# Patient Record
Sex: Female | Born: 1995 | Race: Black or African American | Hispanic: No | Marital: Single | Smoking: Never smoker
Health system: Southern US, Community
[De-identification: ages and names within clinical notes are randomized; demographics above are authoritative.]

## PROBLEM LIST (undated history)

## (undated) DIAGNOSIS — N83201 Unspecified ovarian cyst, right side: Secondary | ICD-10-CM

## (undated) DIAGNOSIS — N83202 Unspecified ovarian cyst, left side: Secondary | ICD-10-CM

## (undated) DIAGNOSIS — D649 Anemia, unspecified: Secondary | ICD-10-CM

## (undated) HISTORY — DX: Anemia, unspecified: D64.9

---

## 2017-07-03 ENCOUNTER — Other Ambulatory Visit: Payer: Self-pay

## 2017-07-03 ENCOUNTER — Encounter (HOSPITAL_COMMUNITY): Payer: Self-pay | Admitting: Emergency Medicine

## 2017-07-03 DIAGNOSIS — Z5321 Procedure and treatment not carried out due to patient leaving prior to being seen by health care provider: Secondary | ICD-10-CM | POA: Insufficient documentation

## 2017-07-03 DIAGNOSIS — G43909 Migraine, unspecified, not intractable, without status migrainosus: Secondary | ICD-10-CM | POA: Insufficient documentation

## 2017-07-03 NOTE — ED Triage Notes (Signed)
Pt reports having headache for the last two weeks along with sensitivity to light along with craving for salt. Pt reports taking tylenol with no relief. Pt denies any nausea or vomiting.

## 2017-07-04 ENCOUNTER — Emergency Department (HOSPITAL_COMMUNITY)
Admission: EM | Admit: 2017-07-04 | Discharge: 2017-07-04 | Disposition: A | Discharge status: Home / Self Care | Payer: Self-pay | Attending: Emergency Medicine | Admitting: Emergency Medicine

## 2017-07-04 HISTORY — DX: Unspecified ovarian cyst, right side: N83.201

## 2017-07-04 HISTORY — DX: Unspecified ovarian cyst, left side: N83.202

## 2017-07-04 NOTE — ED Notes (Signed)
Pt called from the lobby with no response 

## 2017-08-25 ENCOUNTER — Emergency Department (HOSPITAL_COMMUNITY)
Admission: EM | Admit: 2017-08-25 | Discharge: 2017-08-25 | Disposition: A | Discharge status: Home / Self Care | Payer: BLUE CROSS/BLUE SHIELD | Attending: Emergency Medicine | Admitting: Emergency Medicine

## 2017-08-25 ENCOUNTER — Encounter (HOSPITAL_COMMUNITY): Payer: Self-pay | Admitting: Emergency Medicine

## 2017-08-25 ENCOUNTER — Emergency Department (HOSPITAL_COMMUNITY): Payer: BLUE CROSS/BLUE SHIELD

## 2017-08-25 DIAGNOSIS — R51 Headache: Secondary | ICD-10-CM | POA: Diagnosis not present

## 2017-08-25 DIAGNOSIS — R519 Headache, unspecified: Secondary | ICD-10-CM

## 2017-08-25 LAB — I-STAT CHEM 8, ED
BUN: 9 mg/dL (ref 6–20)
Calcium, Ion: 1.18 mmol/L (ref 1.15–1.40)
Chloride: 102 mmol/L (ref 101–111)
Creatinine, Ser: 0.8 mg/dL (ref 0.44–1.00)
Glucose, Bld: 82 mg/dL (ref 65–99)
HEMATOCRIT: 41 % (ref 36.0–46.0)
HEMOGLOBIN: 13.9 g/dL (ref 12.0–15.0)
POTASSIUM: 3.3 mmol/L — AB (ref 3.5–5.1)
Sodium: 140 mmol/L (ref 135–145)
TCO2: 27 mmol/L (ref 22–32)

## 2017-08-25 LAB — POC URINE PREG, ED: PREG TEST UR: NEGATIVE

## 2017-08-25 MED ORDER — BUTALBITAL-APAP-CAFFEINE 50-325-40 MG PO TABS: 1.0000 | ORAL_TABLET | Freq: Four times a day (QID) | ORAL | 0 refills | Status: DC | PRN

## 2017-08-25 NOTE — ED Provider Notes (Signed)
[facility]-EMERGENCY DEPT Provider Note   CSN: [id number] Arrival date & time: 08/25/17  1937     History   Chief Complaint Chief Complaint  Patient presents with  . Headache    HPI Elizabeth Holland is a 22 y.o. female.  HPI   22 year old female with history of ovarian cyst presenting for complaints of headache.  Patient report for the past 2 months she has had recurrent headache.  Headache would happen just about every other day.  She described the headache as a throbbing sensation to her forehead usually worse at nighttime, improves with taking ibuprofen.  Symptom is still recurring, headache is rated as 5 out of 10 currently.  No associated fever, chills, URI symptoms, diplopia, loss of vision, spots in vision, neck stiffness, chest pain, trouble breathing, focal numbness or weakness or rash.  Nothing seems to aggravate her headache.  She was seen by an eye specialist recently for her headache and was told that her vision was fine.  She is here at the recommendation of her parent for further evaluation of her recurrent headache.  She denies any increasing stress or change in her diet.  Past Medical History:  Diagnosis Date  . Bilateral ovarian cysts     There are no active problems to display for this patient.   History reviewed. No pertinent surgical history.  OB History    No data available       Home Medications    Prior to Admission medications   Not on File    Family History No family history on file.  Social History Social History   Tobacco Use  . Smoking status: Never Smoker  . Smokeless tobacco: Never Used  Substance Use Topics  . Alcohol use: Yes    Comment: occasional  . Drug use: No     Allergies   Amoxicillin   Review of Systems Review of Systems  All other systems reviewed and are negative.    Physical Exam Updated Vital Signs BP 120/76 (BP Location: Right Arm)   Pulse 79   Temp 98.2 F (36.8 C) (Oral)    Resp 16   LMP 05/20/2017   SpO2 100%   Physical Exam  Constitutional: She is oriented to person, place, and time. She appears well-developed and well-nourished. No distress.  Patient is well-appearing in no acute discomfort.  HENT:  Head: Normocephalic and atraumatic.  Mouth/Throat: Oropharynx is clear and moist.  Eyes: Conjunctivae and EOM are normal. Pupils are equal, round, and reactive to light.  Neck: Neck supple. No neck rigidity.  Cardiovascular: Normal rate and regular rhythm.  Pulmonary/Chest: Effort normal and breath sounds normal.  Abdominal: Soft. There is no tenderness.  Musculoskeletal: Normal range of motion.  Neurological: She is alert and oriented to person, place, and time. She has normal strength. She displays a negative Romberg sign. Coordination and gait normal. GCS eye subscore is 4. GCS verbal subscore is 5. GCS motor subscore is 6.  Skin: Skin is warm. No rash noted.  Psychiatric: She has a normal mood and affect.  Nursing note and vitals reviewed.    ED Treatments / Results  Labs (all labs ordered are listed, but only abnormal results are displayed) Labs Reviewed - No data to display  EKG  EKG Interpretation None       Radiology No results found.  Procedures Procedures (including critical care time)  Medications Ordered in ED Medications - No data to display   Initial Impression / Assessment  and Plan / ED Course  I have reviewed the triage vital signs and the nursing notes.  Pertinent labs & imaging results that were available during my care of the patient were reviewed by me and considered in my medical decision making (see chart for details).     BP 112/84   Pulse 84   Temp 98.2 F (36.8 C) (Oral)   Resp 18   LMP 05/20/2017   SpO2 100%    Final Clinical Impressions(s) / ED Diagnoses   Final diagnoses:  Recurrent headache    ED Discharge Orders        Ordered    butalbital-acetaminophen-caffeine (FIORICET, ESGIC) 50-325-40  MG tablet  Every 6 hours PRN     08/25/17 2229     8:56 PM Patient here with recurrent frontal headache.  This has been ongoing for 2 months.  She has no focal neuro deficit on exam.  No thunderclap severe headaches suggestive of subarachnoid hemorrhage, no focal neuro deficit to suggest space-occupying lesion or stroke.  No fever or nuchal rigidity concerning for meningitis.  No history of IV drug use or active cancer.  She does request to have a CT scan of her head.  She acknowledged the risk and benefit of CT scan.  10:22 PM Pregnancy test is negative.  I-STAT Chem-8 is reassuring, normal H&H, mild hypokalemia with a potassium of 3.3.  I encourage patient to eat a banana daily this week to help replenish her potassium.  Her head CT scan is totally normal.  Patient felt reassured.  Patient will be discharged home with Fioricet to use as needed for headache.  Recommend patient to follow-up with neurology as well as to keep a headache diary to help identify the cause of her headache.  Return precautions discussed.   Fayrene Helperran, Courtlynn Holloman, PA-C 08/25/17 2230    Donnetta Hutchingook, Brian, MD 08/27/17 2028

## 2017-08-25 NOTE — ED Triage Notes (Signed)
Patient c/o headaches x 2 months. Pt states she went to eye doc to see if that was causing pain. No issues with eye sight. Pain worsens when driving at night. Denies any light sensitivity or nausea. Reports taking ibuprofen daily and melatonin to help sleep.

## 2017-10-15 ENCOUNTER — Emergency Department (HOSPITAL_COMMUNITY)
Admission: EM | Admit: 2017-10-15 | Discharge: 2017-10-15 | Disposition: A | Discharge status: Home / Self Care | Payer: BLUE CROSS/BLUE SHIELD | Attending: Emergency Medicine | Admitting: Emergency Medicine

## 2017-10-15 ENCOUNTER — Encounter (HOSPITAL_COMMUNITY): Payer: Self-pay | Admitting: Emergency Medicine

## 2017-10-15 DIAGNOSIS — Z79899 Other long term (current) drug therapy: Secondary | ICD-10-CM | POA: Diagnosis not present

## 2017-10-15 DIAGNOSIS — J029 Acute pharyngitis, unspecified: Secondary | ICD-10-CM | POA: Insufficient documentation

## 2017-10-15 LAB — GROUP A STREP BY PCR: GROUP A STREP BY PCR: NOT DETECTED

## 2017-10-15 MED ORDER — AZITHROMYCIN 250 MG PO TABS: 250.0000 mg | ORAL_TABLET | Freq: Every day | ORAL | 0 refills | Status: DC

## 2017-10-15 NOTE — ED Provider Notes (Signed)
[facility]-EMERGENCY DEPT Provider Note   CSN: [id number] Arrival date & time: 10/15/17  [id number]     History   Chief Complaint Chief Complaint  Patient presents with  . Sore Throat    HPI Elizabeth Holland is a 22 y.o. female.  HPI  Elizabeth Holland is a 22 y.o. female presents to emergency department complaining of sore throat for 2 days.  She reports associated fever, minor nasal congestion, reports no cough, no nausea, vomiting, diarrhea.  States painful to swallow but still able to swallow.  Denies change in voice.  States fever for the last 2 days, improved today.  States has had strep throat in the past feels the same.  Has been taking Tylenol for her pain.   Past Medical History:  Diagnosis Date  . Bilateral ovarian cysts     There are no active problems to display for this patient.   History reviewed. No pertinent surgical history.   OB History   None      Home Medications    Prior to Admission medications   Medication Sig Start Date End Date Taking? Authorizing Provider  butalbital-acetaminophen-caffeine (FIORICET, ESGIC) [phone] MG tablet Take 1 tablet by mouth every 6 (six) hours as needed for headache. 08/25/17 08/25/18 Yes Fayrene Helperran, [place], PA-C  etonogestrel (NEXPLANON) 68 MG IMPL implant Inject 1 each into the skin once.   Yes [provider]  ibuprofen (ADVIL,MOTRIN) 200 MG tablet Take 600 mg by mouth every 6 (six) hours as needed for moderate pain.   Yes [provider]  MELATONIN PO Take 1 tablet by mouth at bedtime as needed (sleep).   Yes [provider]    Family History No family history on file.  Social History Social History   Tobacco Use  . Smoking status: Never Smoker  . Smokeless tobacco: Never Used  Substance Use Topics  . Alcohol use: Yes    Comment: occasional  . Drug use: No     Allergies   Amoxicillin   Review of Systems Review of Systems  Constitutional: Positive for chills and  fever.  HENT: Positive for congestion and sore throat.   Respiratory: Negative for cough, chest tightness and shortness of breath.   Cardiovascular: Negative for chest pain, palpitations and leg swelling.  Gastrointestinal: Negative for abdominal pain, diarrhea, nausea and vomiting.  Musculoskeletal: Negative for arthralgias, myalgias, neck pain and neck stiffness.  Skin: Negative for rash.  Neurological: Negative for dizziness, weakness and headaches.  All other systems reviewed and are negative.    Physical Exam Updated Vital Signs BP 117/72 (BP Location: Left Arm)   Pulse 87   Temp 98.1 F (36.7 C) (Oral)   Resp 14   Ht 5\' 2"  (1.575 m)   LMP 09/27/2017   SpO2 99%   BMI 31.09 kg/m   Physical Exam  Constitutional: She appears well-developed and well-nourished. No distress.  HENT:  Head: Normocephalic.  Oropharynx is erythematous, tonsils 2+ bilaterally, exudate present.  Uvula is midline.  No trismus.  Eyes: Conjunctivae are normal.  Neck: Neck supple.  Tender anterior cervical lymphadenopathy  Cardiovascular: Normal rate, regular rhythm and normal heart sounds.  Pulmonary/Chest: Effort normal and breath sounds normal. No respiratory distress. She has no wheezes. She has no rales.  Abdominal: She exhibits no distension.  Musculoskeletal: She exhibits no edema.  Lymphadenopathy:    She has cervical adenopathy.  Neurological: She is alert.  Skin: Skin is warm and dry.  Psychiatric: She has a normal  mood and affect. Her behavior is normal.  Nursing note and vitals reviewed.    ED Treatments / Results  Labs (all labs ordered are listed, but only abnormal results are displayed) Labs Reviewed  GROUP A STREP BY PCR    EKG None  Radiology No results found.  Procedures Procedures (including critical care time)  Medications Ordered in ED Medications - No data to display   Initial Impression / Assessment and Plan / ED Course  I have reviewed the triage vital  signs and the nursing notes.  Pertinent labs & imaging results that were available during my care of the patient were reviewed by me and considered in my medical decision making (see chart for details).     Patient emergency department with 2 days of sore throat, white exudate noticed on tonsils bilaterally, tonsils are erythematous, mildly enlarged.  Uvula is midline, no concern about peritonsillar or retropharyngeal abscess based on the exam.  She is able to swallow.  She is afebrile here, normal vital signs.  Most likely viral versus bacterial pharyngitis.  Strep by PCR is negative.  Discussed symptomatic treatment for viral pharyngitis, and if not improving, I will prescribe her antibiotic that she can take.  Strongly encouraged to not take antibiotics unless she is not getting better.  Patient is concerned that it is holiday weekend, he does not want to come back to emergency department, so I will give her prescription.  Return precautions discussed.  Vitals:   10/15/17 0934  BP: 117/72  Pulse: 87  Resp: 14  Temp: 98.1 F (36.7 C)  SpO2: 99%    Final Clinical Impressions(s) / ED Diagnoses   Final diagnoses:  Pharyngitis, unspecified etiology    ED Discharge Orders        Ordered    azithromycin (ZITHROMAX) 250 MG tablet  Daily     10/15/17 1226       Jaynie Crumble, PA-C 10/15/17 1227    Shaune Pollack, MD 10/16/17 [phone]

## 2017-10-15 NOTE — Discharge Instructions (Addendum)
Your sore throat is most likely is caused by a viral infection, which is self limited and most of the time requires no prescription medications.   Take Tylenol or Motrin for pain.  Salt water gargles several times a day.  Drink plenty of fluids.  If not improving in 2 to 3 days, take antibiotics as prescribed.  Return if worsening symptoms.

## 2017-10-15 NOTE — ED Notes (Signed)
Bed: WTR9 Expected date:  Expected time:  Means of arrival:  Comments: 

## 2017-10-15 NOTE — ED Triage Notes (Signed)
Pt c/o sore throat couple days and noticed white spots on her throat this morning.

## 2018-05-28 ENCOUNTER — Other Ambulatory Visit: Payer: Self-pay

## 2018-05-28 ENCOUNTER — Encounter (HOSPITAL_COMMUNITY): Payer: Self-pay

## 2018-05-28 ENCOUNTER — Emergency Department (HOSPITAL_COMMUNITY)
Admission: EM | Admit: 2018-05-28 | Discharge: 2018-05-28 | Disposition: A | Discharge status: Home / Self Care | Payer: BLUE CROSS/BLUE SHIELD | Attending: Emergency Medicine | Admitting: Emergency Medicine

## 2018-05-28 DIAGNOSIS — E876 Hypokalemia: Secondary | ICD-10-CM | POA: Diagnosis not present

## 2018-05-28 DIAGNOSIS — D649 Anemia, unspecified: Secondary | ICD-10-CM | POA: Diagnosis not present

## 2018-05-28 DIAGNOSIS — Z79899 Other long term (current) drug therapy: Secondary | ICD-10-CM | POA: Diagnosis not present

## 2018-05-28 DIAGNOSIS — R002 Palpitations: Secondary | ICD-10-CM | POA: Diagnosis not present

## 2018-05-28 LAB — CBC WITH DIFFERENTIAL/PLATELET
Abs Immature Granulocytes: 0.02 10*3/uL (ref 0.00–0.07)
BASOS PCT: 1 %
Basophils Absolute: 0 10*3/uL (ref 0.0–0.1)
EOS ABS: 0.1 10*3/uL (ref 0.0–0.5)
EOS PCT: 1 %
HEMATOCRIT: 36.7 % (ref 36.0–46.0)
Hemoglobin: 11.2 g/dL — ABNORMAL LOW (ref 12.0–15.0)
IMMATURE GRANULOCYTES: 0 %
LYMPHS ABS: 2.5 10*3/uL (ref 0.7–4.0)
Lymphocytes Relative: 36 %
MCH: 26.5 pg (ref 26.0–34.0)
MCHC: 30.5 g/dL (ref 30.0–36.0)
MCV: 87 fL (ref 80.0–100.0)
MONOS PCT: 11 %
Monocytes Absolute: 0.7 10*3/uL (ref 0.1–1.0)
NEUTROS PCT: 51 %
Neutro Abs: 3.5 10*3/uL (ref 1.7–7.7)
PLATELETS: 261 10*3/uL (ref 150–400)
RBC: 4.22 MIL/uL (ref 3.87–5.11)
RDW: 13.3 % (ref 11.5–15.5)
WBC: 6.9 10*3/uL (ref 4.0–10.5)
nRBC: 0 % (ref 0.0–0.2)

## 2018-05-28 LAB — BASIC METABOLIC PANEL
ANION GAP: 6 (ref 5–15)
BUN: 10 mg/dL (ref 6–20)
CALCIUM: 8.7 mg/dL — AB (ref 8.9–10.3)
CO2: 26 mmol/L (ref 22–32)
Chloride: 105 mmol/L (ref 98–111)
Creatinine, Ser: 0.78 mg/dL (ref 0.44–1.00)
GLUCOSE: 93 mg/dL (ref 70–99)
Potassium: 3.4 mmol/L — ABNORMAL LOW (ref 3.5–5.1)
Sodium: 137 mmol/L (ref 135–145)

## 2018-05-28 LAB — TSH: TSH: 0.985 u[IU]/mL (ref 0.350–4.500)

## 2018-05-28 LAB — I-STAT BETA HCG BLOOD, ED (MC, WL, AP ONLY): I-stat hCG, quantitative: <5 m[IU]/mL (ref <5)

## 2018-05-28 NOTE — Discharge Instructions (Addendum)
Try a multivitamin once daily for low potassium and calcium Please f/u with Cardiology if you continue to have symptoms

## 2018-05-28 NOTE — ED Provider Notes (Signed)
[facility]-EMERGENCY DEPT Provider Note   CSN: [id number] Arrival date & time: 05/28/18  1631     History   Chief Complaint Chief Complaint  Patient presents with  . Palpitations    HPI Elizabeth Holland is a 22 y.o. female who presents with heart palpitations.  No significant past medical history the patient states that for the past month she has had intermittent palpitations she feels in her chest and her throat.  She states that she initially did not think anything of it however it has become more frequent and is happening every other day now therefore she decided to come to the ED to have it checked.  She was getting go to a primary care provider however could not get an appointment because she is not from here.  She is currently going to and [employer] and works 2 jobs.  She recently started a new job this past week.  She is unsure if her symptoms are due to anxiety or not.  She does not feel particularly anxious.  She denies any excessive caffeine intake, illicit drug use.  She has mild shortness of breath with the palpitations which will last a couple of seconds and then resolve spontaneously.  The palpitations will happen at random times with exertion or at rest.  She denies any fever, lightheadedness, syncope, chest pain, leg swelling.  She denies any current symptoms.  HPI  Past Medical History:  Diagnosis Date  . Bilateral ovarian cysts     There are no active problems to display for this patient.   History reviewed. No pertinent surgical history.   OB History   None      Home Medications    Prior to Admission medications   Medication Sig Start Date End Date Taking? Authorizing Provider  etonogestrel (NEXPLANON) 68 MG IMPL implant Inject 1 each into the skin once.   Yes [provider]  butalbital-acetaminophen-caffeine (FIORICET, ESGIC) 50-325-40 MG tablet Take 1 tablet by mouth every 6 (six) hours as needed for headache. Patient not  taking: Reported on 05/28/2018 08/25/17 08/25/18  Fayrene Helperran, [place], PA-C    Family History History reviewed. No pertinent family history.  Social History Social History   Tobacco Use  . Smoking status: Never Smoker  . Smokeless tobacco: Never Used  Substance Use Topics  . Alcohol use: Yes    Comment: occasional  . Drug use: No     Allergies   Amoxicillin   Review of Systems Review of Systems  Constitutional: Negative for fever.  Respiratory: Positive for shortness of breath (resolved).   Cardiovascular: Positive for palpitations. Negative for chest pain and leg swelling.  Neurological: Negative for syncope and light-headedness.  All other systems reviewed and are negative.    Physical Exam Updated Vital Signs BP 120/75 (BP Location: Right Arm)   Pulse 68   Temp 98.7 F (37.1 C) (Oral)   Resp (!) 9   SpO2 100%   Physical Exam  Constitutional: She is oriented to person, place, and time. She appears well-developed and well-nourished. No distress.  Calm, cooperative. Well appearing  HENT:  Head: Normocephalic and atraumatic.  Eyes: Pupils are equal, round, and reactive to light. Conjunctivae are normal. Right eye exhibits no discharge. Left eye exhibits no discharge. No scleral icterus.  Neck: Normal range of motion.  Cardiovascular: Normal rate and regular rhythm.  Pulmonary/Chest: Effort normal and breath sounds normal. No respiratory distress.  Abdominal: Soft. Bowel sounds are normal. She exhibits no distension.  There is no tenderness.  Musculoskeletal:  No peripheral edema  Neurological: She is alert and oriented to person, place, and time.  Skin: Skin is warm and dry.  Psychiatric: She has a normal mood and affect. Her behavior is normal.  Nursing note and vitals reviewed.    ED Treatments / Results  Labs (all labs ordered are listed, but only abnormal results are displayed) Labs Reviewed  BASIC METABOLIC PANEL - Abnormal; Notable for the following  components:      Result Value   Potassium 3.4 (*)    Calcium 8.7 (*)    All other components within normal limits  CBC WITH DIFFERENTIAL/PLATELET - Abnormal; Notable for the following components:   Hemoglobin 11.2 (*)    All other components within normal limits  TSH  I-STAT BETA HCG BLOOD, ED ([facility], [facility], [facility] ONLY)    EKG None  Radiology No results found.  Procedures Procedures (including critical care time)  Medications Ordered in ED Medications - No data to display   Initial Impression / Assessment and Plan / ED Course  I have reviewed the triage vital signs and the nursing notes.  Pertinent labs & imaging results that were available during my care of the patient were reviewed by me and considered in my medical decision making (see chart for details).  22 year old with palpitations for the past month. Vitals are normal. Exam is unremarkable. Unclear etiology - possibly electrolyte imbalance vs anxiety/stress. EKG is SR. CBC is remarkable for mild anemia. BMP is remarkable for mild hypokalemia and hypocalcemia. TSH is normal. Encouraged her to take a multivitamin and to f/u with Cards if she continues to have symptoms. She verbalized understanding.  Final Clinical Impressions(s) / ED Diagnoses   Final diagnoses:  Palpitations  Hypokalemia  Hypocalcemia  Anemia, unspecified type    ED Discharge Orders    None       Bethel Born, PA-C 05/28/18 2218    Sabas Sous, MD 05/29/18 [phone]

## 2018-05-28 NOTE — ED Notes (Signed)
Bed: WA02 Expected date:  Expected time:  Means of arrival:  Comments: 

## 2018-05-28 NOTE — ED Triage Notes (Signed)
Pt reports intermittent heart palpitations over the last month. She reports 2 episodes this morning. States that she is symptoms free at time of triage. Denies SOB, N/V/D or anxiety. States that she cannot identify a cause and it seems random when the palpitations start and they do not last long. VSS. A&Ox4.

## 2018-06-01 IMAGING — CT CT HEAD W/O CM
3 series · 16 of 47 positions shown, 19 images · non-contrast
Comparison: None.

CLINICAL DATA: Headaches

EXAM:
CT HEAD WITHOUT CONTRAST
TECHNIQUE: Contiguous axial images were obtained from the base of the skull
through the vertex without intravenous contrast.

[Series 2: head wo · axial · 0.47mm/px · z∈[+1536,+1661]mm · 10 of 30 slices shown, 13 images]
[im 3/30  brain]
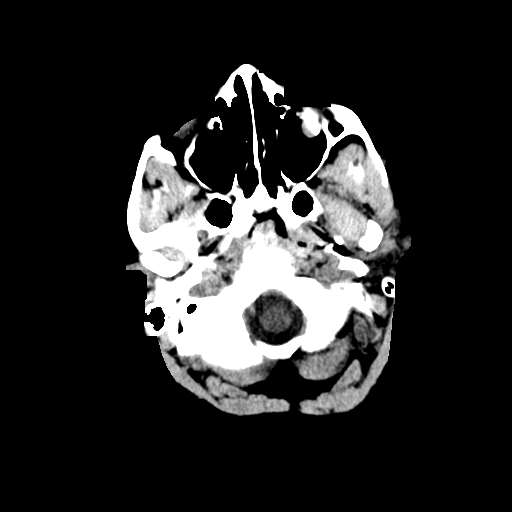
[im 3/30  bone]
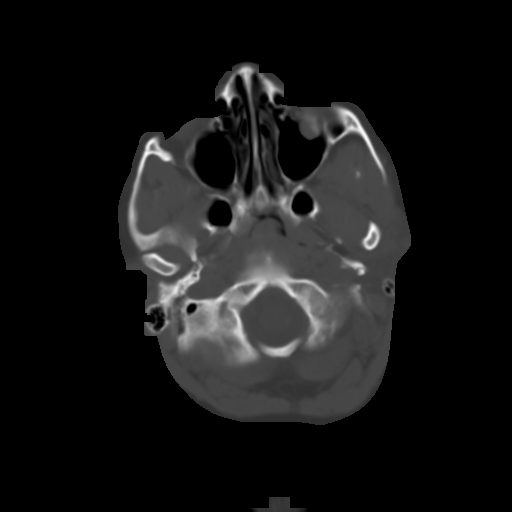
[im 6/30  brain]
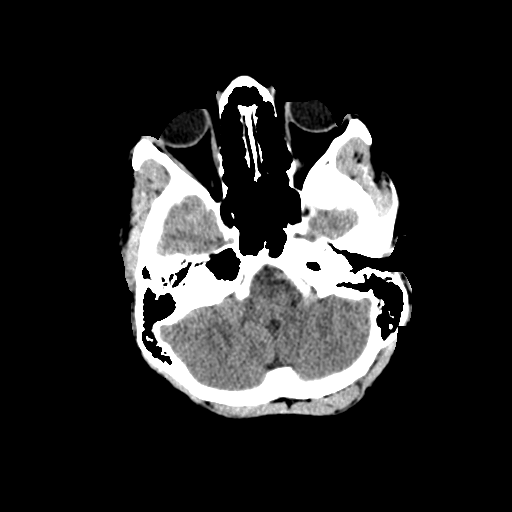
[im 9/30  brain]
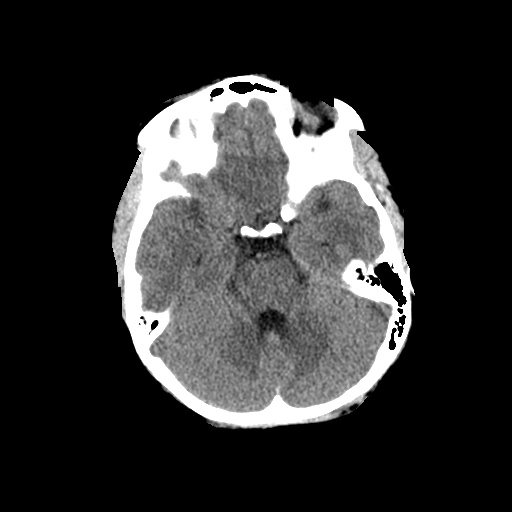
[im 11/30  brain]
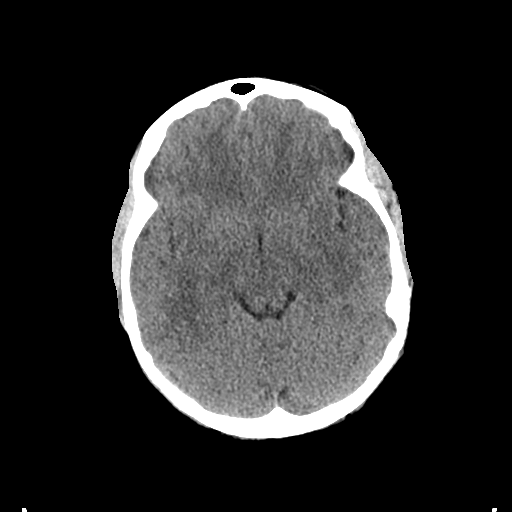
[im 14/30  brain]
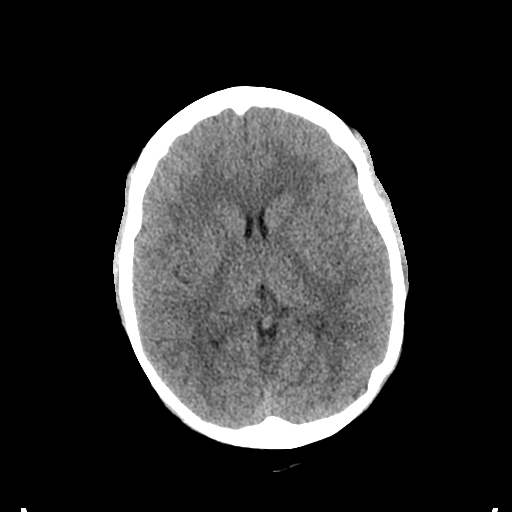
[im 14/30  bone]
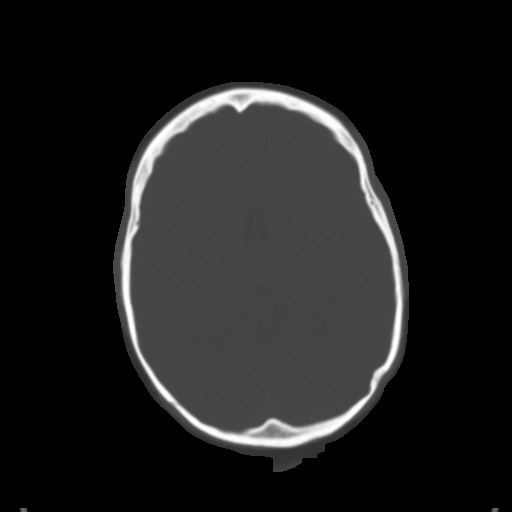
[im 17/30  brain]
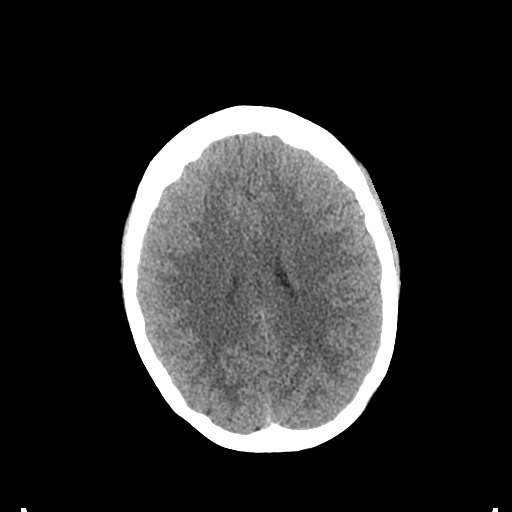
[im 20/30  brain]
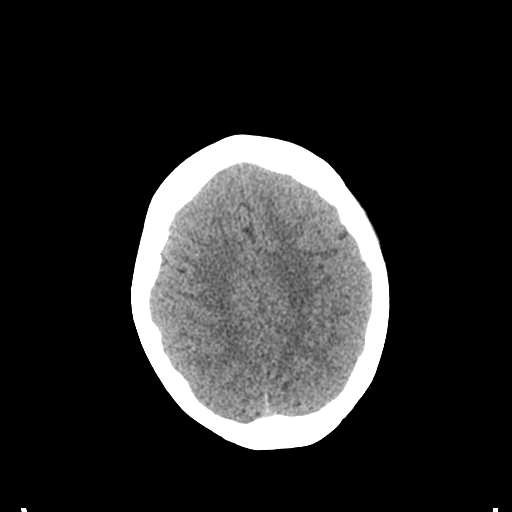
[im 23/30  brain]
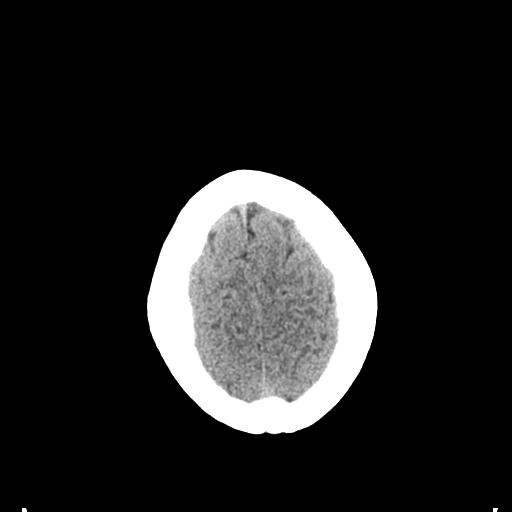
[im 25/30  brain]
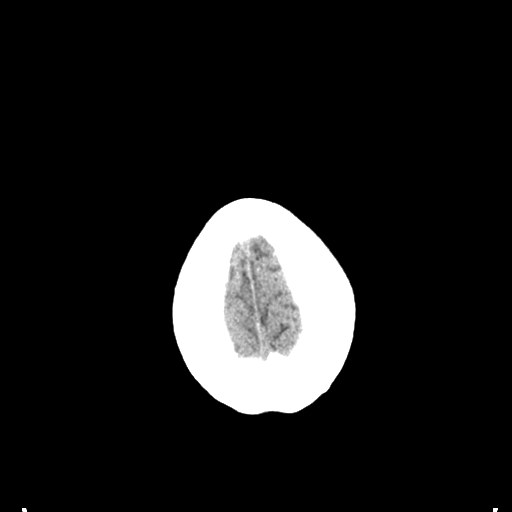
[im 25/30  bone]
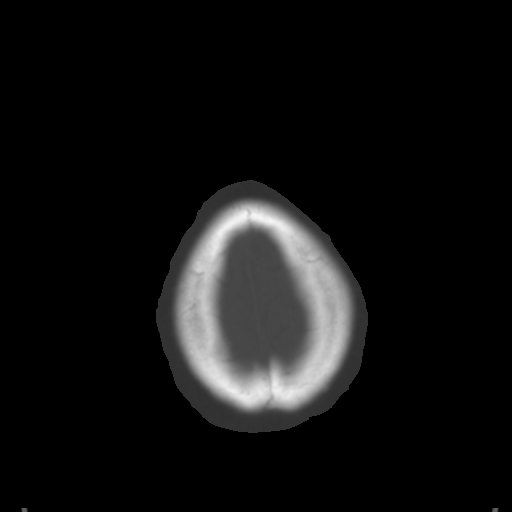
[im 28/30  brain]
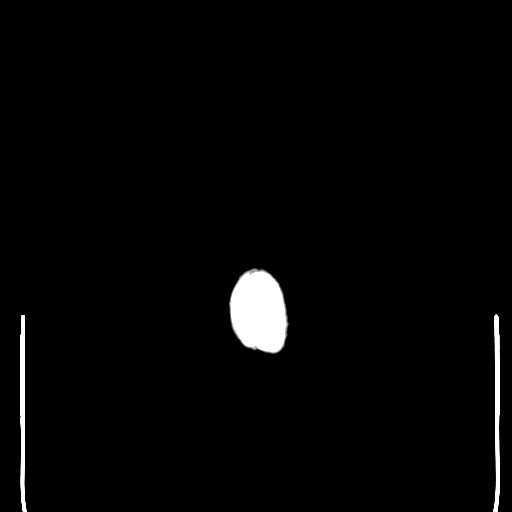

[Series 4: coronal soft tissue · coronal · 0.28mm/px · 3 of 59 slices shown]
[im 20/59  brain]
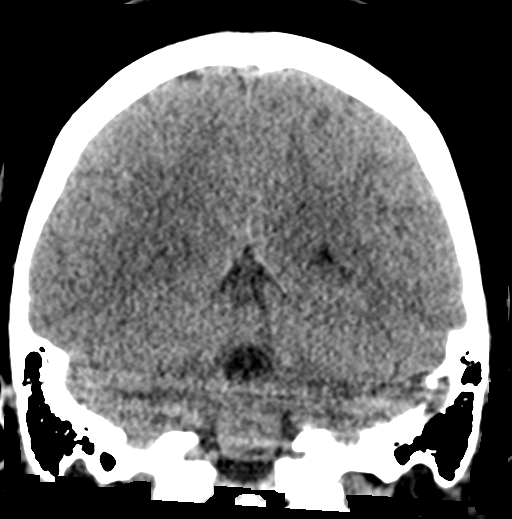
[im 26/59  brain]
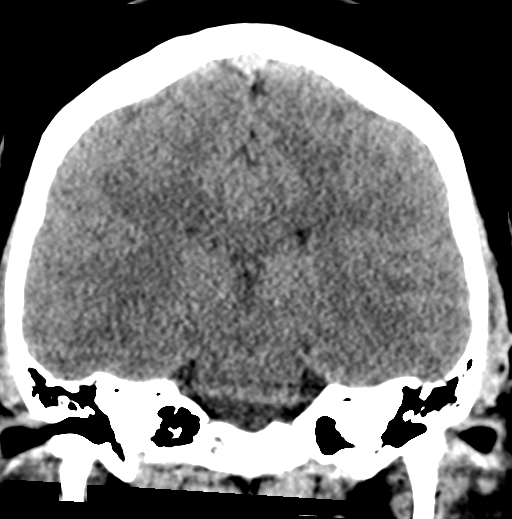
[im 33/59  brain]
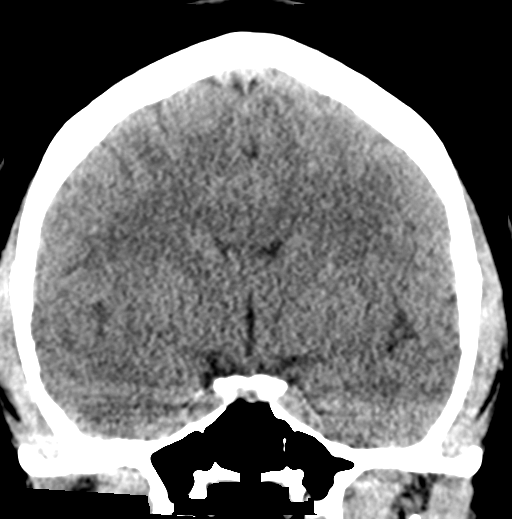

[Series 5: sagittal soft tissue · sagittal · 0.28mm/px · 3 of 48 slices shown]
[im 16/48  brain]
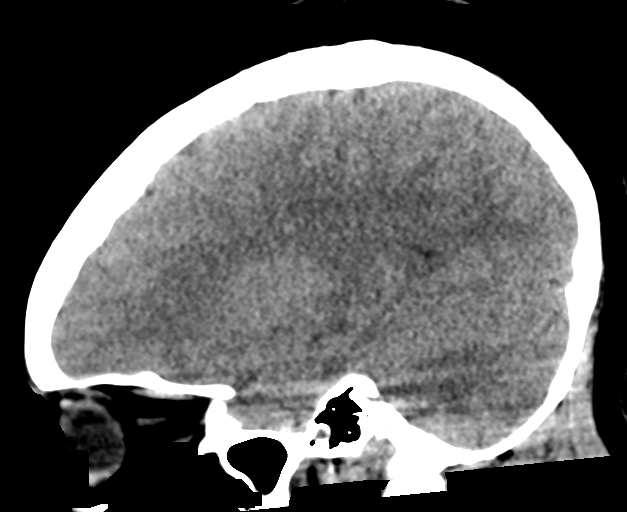
[im 24/48  brain]
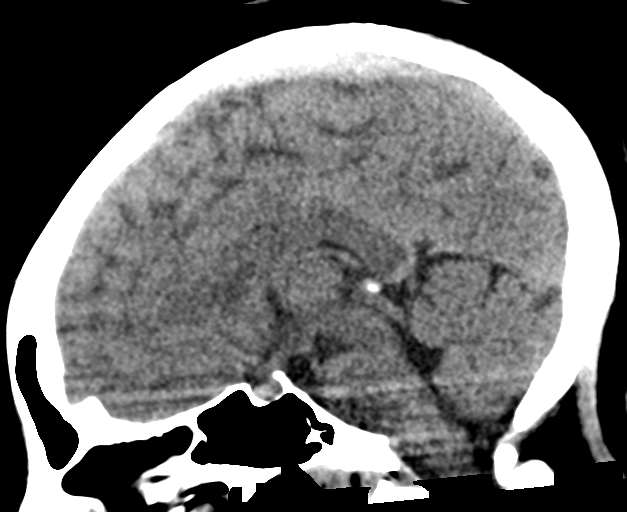
[im 32/48  brain]
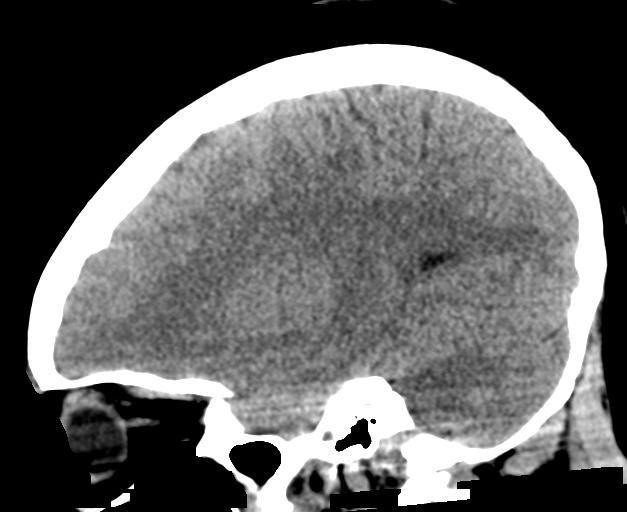

[16 of 47 positions shown; findings below may reference images not displayed]

FINDINGS: Brain: No acute intracranial abnormality. Specifically, no
hemorrhage, hydrocephalus, mass lesion, acute infarction, or
significant intracranial injury.

Vascular: No hyperdense vessel or unexpected calcification.

Skull: No acute calvarial abnormality.

Sinuses/Orbits: Visualized paranasal sinuses and mastoids clear.
Orbital soft tissues unremarkable.

Other: None
IMPRESSION: Normal study.

## 2018-09-02 ENCOUNTER — Other Ambulatory Visit: Payer: Self-pay

## 2018-09-02 ENCOUNTER — Encounter (HOSPITAL_COMMUNITY): Payer: Self-pay | Admitting: Emergency Medicine

## 2018-09-02 ENCOUNTER — Emergency Department (HOSPITAL_COMMUNITY)
Admission: EM | Admit: 2018-09-02 | Discharge: 2018-09-02 | Disposition: A | Discharge status: Home / Self Care | Payer: BLUE CROSS/BLUE SHIELD | Attending: Emergency Medicine | Admitting: Emergency Medicine

## 2018-09-02 DIAGNOSIS — G43909 Migraine, unspecified, not intractable, without status migrainosus: Secondary | ICD-10-CM | POA: Diagnosis not present

## 2018-09-02 DIAGNOSIS — R51 Headache: Secondary | ICD-10-CM | POA: Diagnosis present

## 2018-09-02 DIAGNOSIS — G43009 Migraine without aura, not intractable, without status migrainosus: Secondary | ICD-10-CM

## 2018-09-02 LAB — BASIC METABOLIC PANEL
Anion gap: 6 (ref 5–15)
BUN: 8 mg/dL (ref 6–20)
CO2: 26 mmol/L (ref 22–32)
CREATININE: 0.69 mg/dL (ref 0.44–1.00)
Calcium: 8.9 mg/dL (ref 8.9–10.3)
Chloride: 105 mmol/L (ref 98–111)
GFR calc Af Amer: >60 mL/min (ref >60)
GLUCOSE: 95 mg/dL (ref 70–99)
Potassium: 3.6 mmol/L (ref 3.5–5.1)
Sodium: 137 mmol/L (ref 135–145)

## 2018-09-02 LAB — CBC WITH DIFFERENTIAL/PLATELET
ABS IMMATURE GRANULOCYTES: 0.03 10*3/uL (ref 0.00–0.07)
BASOS PCT: 0 %
Basophils Absolute: 0 10*3/uL (ref 0.0–0.1)
EOS PCT: 1 %
Eosinophils Absolute: 0.1 10*3/uL (ref 0.0–0.5)
HCT: 40.3 % (ref 36.0–46.0)
HEMOGLOBIN: 12 g/dL (ref 12.0–15.0)
Immature Granulocytes: 0 %
LYMPHS PCT: 26 %
Lymphs Abs: 2.3 10*3/uL (ref 0.7–4.0)
MCH: 26.3 pg (ref 26.0–34.0)
MCHC: 29.8 g/dL — ABNORMAL LOW (ref 30.0–36.0)
MCV: 88.4 fL (ref 80.0–100.0)
MONO ABS: 0.8 10*3/uL (ref 0.1–1.0)
MONOS PCT: 9 %
NEUTROS ABS: 5.6 10*3/uL (ref 1.7–7.7)
Neutrophils Relative %: 64 %
Platelets: 265 10*3/uL (ref 150–400)
RBC: 4.56 MIL/uL (ref 3.87–5.11)
RDW: 14.2 % (ref 11.5–15.5)
WBC: 8.8 10*3/uL (ref 4.0–10.5)
nRBC: 0 % (ref 0.0–0.2)

## 2018-09-02 LAB — I-STAT BETA HCG BLOOD, ED (MC, WL, AP ONLY): I-stat hCG, quantitative: <5 m[IU]/mL (ref <5)

## 2018-09-02 MED ORDER — PROCHLORPERAZINE EDISYLATE 10 MG/2ML IJ SOLN
5.0000 mg | Freq: Once | INTRAMUSCULAR | Status: AC
  Administered 2018-09-02: 5 mg via INTRAVENOUS
  Filled 2018-09-02: qty 2

## 2018-09-02 MED ORDER — DIPHENHYDRAMINE HCL 50 MG/ML IJ SOLN
12.5000 mg | Freq: Once | INTRAMUSCULAR | Status: AC
  Administered 2018-09-02: 12.5 mg via INTRAVENOUS
  Filled 2018-09-02: qty 1

## 2018-09-02 MED ORDER — SODIUM CHLORIDE 0.9 % IV BOLUS
1000.0000 mL | Freq: Once | INTRAVENOUS | Status: AC
  Administered 2018-09-02: 1000 mL via INTRAVENOUS

## 2018-09-02 NOTE — Discharge Instructions (Signed)
Return to the ED if you start to have worsening symptoms, blurry vision, numbness in arms or legs, vomiting or coughing up blood.

## 2018-09-02 NOTE — ED Triage Notes (Signed)
Pt reports that over past month had more frequent headaches with nausea and sensitivity to light and smells. Reports been taking tylenol and ibuprofen without relief. Pt doesn't have a PCP in [place] to see regularly.

## 2018-09-02 NOTE — ED Provider Notes (Signed)
[facility]-EMERGENCY DEPT Provider Note   CSN: [id number] Arrival date & time: 09/02/18  1450    History   Chief Complaint Chief Complaint  Patient presents with  . Headache    HPI Elizabeth Holland is a 23 y.o. female who presents to ED for intermittent migraine headaches for the past month.  She reports throbbing headache with associated nausea, photophobia and phonophobia.  States that she will get headaches every other day.  These will intermittently improve with ibuprofen and Tylenol. She is unsure if her history of low iron caused the headaches. She restarted taking her iron supplementation but continues to have headaches.  She cannot recall any incident that may have triggered her symptoms.  She did take a Plan B pill in January 2020 and has not had a normal menstrual cycle since then.  She denies any neck pain, fever, sinus pain or pressure, numbness in arms or legs, vomiting, sick contacts, personal or family history of brain aneurysms.     HPI  Past Medical History:  Diagnosis Date  . Bilateral ovarian cysts     There are no active problems to display for this patient.   History reviewed. No pertinent surgical history.   OB History   No obstetric history on file.      Home Medications    Prior to Admission medications   Medication Sig Start Date End Date Taking? Authorizing Provider  ibuprofen (ADVIL,MOTRIN) 200 MG tablet Take 800 mg by mouth daily as needed for moderate pain.   Yes [provider]    Family History No family history on file.  Social History Social History   Tobacco Use  . Smoking status: Never Smoker  . Smokeless tobacco: Never Used  Substance Use Topics  . Alcohol use: Yes    Comment: occasional  . Drug use: No     Allergies   Amoxicillin   Review of Systems Review of Systems  Constitutional: Negative for appetite change, chills and fever.  HENT: Negative for ear pain, rhinorrhea, sneezing and  sore throat.   Eyes: Positive for photophobia. Negative for visual disturbance.  Respiratory: Negative for cough, chest tightness, shortness of breath and wheezing.   Cardiovascular: Negative for chest pain and palpitations.  Gastrointestinal: Positive for nausea. Negative for abdominal pain, blood in stool, constipation, diarrhea and vomiting.  Genitourinary: Negative for dysuria, hematuria and urgency.  Musculoskeletal: Negative for myalgias.  Skin: Negative for rash.  Neurological: Positive for headaches. Negative for dizziness, weakness and light-headedness.     Physical Exam Updated Vital Signs BP 112/77   Pulse 95   Temp 98.3 F (36.8 C) (Oral)   Resp 16   LMP 06/25/2018   SpO2 98%   Physical Exam Vitals signs and nursing note reviewed.  Constitutional:      General: She is not in acute distress.    Appearance: She is well-developed.  HENT:     Head: Normocephalic and atraumatic.     Nose: Nose normal.  Eyes:     General: No scleral icterus.       Right eye: No discharge.        Left eye: No discharge.     Conjunctiva/sclera: Conjunctivae normal.     Pupils: Pupils are equal, round, and reactive to light.  Neck:     Musculoskeletal: Normal range of motion and neck supple.     Comments: No meningismus. Cardiovascular:     Rate and Rhythm: Normal rate and regular rhythm.  Heart sounds: Normal heart sounds. No murmur. No friction rub. No gallop.   Pulmonary:     Effort: Pulmonary effort is normal. No respiratory distress.     Breath sounds: Normal breath sounds.  Abdominal:     General: Bowel sounds are normal. There is no distension.     Palpations: Abdomen is soft.     Tenderness: There is no abdominal tenderness. There is no guarding.  Musculoskeletal: Normal range of motion.  Skin:    General: Skin is warm and dry.     Findings: No rash.  Neurological:     General: No focal deficit present.     Mental Status: She is alert and oriented to person, place,  and time.     Cranial Nerves: No cranial nerve deficit.     Sensory: No sensory deficit.     Motor: No weakness or abnormal muscle tone.     Coordination: Coordination normal.     Comments: Pupils reactive. No facial asymmetry noted. Cranial nerves appear grossly intact. Sensation intact to light touch on face, BUE and BLE. Strength 5/5 in BUE and BLE.       ED Treatments / Results  Labs (all labs ordered are listed, but only abnormal results are displayed) Labs Reviewed  CBC WITH DIFFERENTIAL/PLATELET - Abnormal; Notable for the following components:      Result Value   MCHC 29.8 (*)    All other components within normal limits  BASIC METABOLIC PANEL  I-STAT BETA HCG BLOOD, ED ([facility], [facility], [facility] ONLY)    EKG None  Radiology No results found.  Procedures Procedures (including critical care time)  Medications Ordered in ED Medications  sodium chloride 0.9 % bolus 1,000 mL (0 mLs Intravenous Stopped 09/02/18 1751)  prochlorperazine (COMPAZINE) injection 5 mg (5 mg Intravenous Given 09/02/18 1644)  diphenhydrAMINE (BENADRYL) injection 12.5 mg (12.5 mg Intravenous Given 09/02/18 1645)     Initial Impression / Assessment and Plan / ED Course  I have reviewed the triage vital signs and the nursing notes.  Pertinent labs & imaging results that were available during my care of the patient were reviewed by me and considered in my medical decision making (see chart for details).        23 year old female presents to ED for intermittent headaches for the past month.  These have been improved with Tylenol and ibuprofen which she takes as needed.  She is unsure if this is related to low iron or taking a Plan B pill 2 months ago.  She is currently taking iron supplementation but continues to have headaches.  Denies any trauma.  Denies any numbness to arms or legs, vision changes, family personal history of brain aneurysms.  On my exam there are no deficits neurological exam noted.  No  meningismus.  Her vital signs are within normal limits.  Lab work including CBC, BMP, hCG unremarkable.  Patient was given fluids, Compazine and Benadryl with resolution of her symptoms.  Suspect that her symptoms are due to migraine. There are no headache characteristics that are lateralizing or concerning for increased ICP, infectious or vascular cause of her symptoms.  We will have her follow-up with PCP and return to ED for any severe worsening symptoms.  Patient is hemodynamically stable, in NAD, and able to ambulate in the ED. Evaluation does not show pathology that would require ongoing emergent intervention or inpatient treatment. I explained the diagnosis to the patient. Pain has been managed and has no complaints prior to  discharge. Patient is comfortable with above plan and is stable for discharge at this time. All questions were answered prior to disposition. Strict return precautions for returning to the ED were discussed. Encouraged follow up with PCP.    Portions of this note were generated with Scientist, clinical (histocompatibility and immunogenetics). Dictation errors may occur despite best attempts at proofreading.   Final Clinical Impressions(s) / ED Diagnoses   Final diagnoses:  Migraine without aura and without status migrainosus, not intractable    ED Discharge Orders    None       Dietrich Pates, PA-C 09/02/18 1756    Melene Plan, DO 09/02/18 2225

## 2019-07-11 ENCOUNTER — Ambulatory Visit (INDEPENDENT_AMBULATORY_CARE_PROVIDER_SITE_OTHER): Payer: BC Managed Care – PPO

## 2019-07-11 ENCOUNTER — Other Ambulatory Visit: Payer: Self-pay

## 2019-07-11 VITALS — BP 109/73 | HR 68 | Ht 62.0 in | Wt 187.0 lb

## 2019-07-11 DIAGNOSIS — N760 Acute vaginitis: Secondary | ICD-10-CM | POA: Diagnosis not present

## 2019-07-11 DIAGNOSIS — Z01419 Encounter for gynecological examination (general) (routine) without abnormal findings: Secondary | ICD-10-CM | POA: Diagnosis not present

## 2019-07-11 DIAGNOSIS — N898 Other specified noninflammatory disorders of vagina: Secondary | ICD-10-CM

## 2019-07-11 DIAGNOSIS — Z8742 Personal history of other diseases of the female genital tract: Secondary | ICD-10-CM | POA: Insufficient documentation

## 2019-07-11 DIAGNOSIS — B373 Candidiasis of vulva and vagina: Secondary | ICD-10-CM

## 2019-07-11 DIAGNOSIS — Z113 Encounter for screening for infections with a predominantly sexual mode of transmission: Secondary | ICD-10-CM | POA: Diagnosis not present

## 2019-07-11 DIAGNOSIS — Z124 Encounter for screening for malignant neoplasm of cervix: Secondary | ICD-10-CM

## 2019-07-11 DIAGNOSIS — B9689 Other specified bacterial agents as the cause of diseases classified elsewhere: Secondary | ICD-10-CM | POA: Diagnosis not present

## 2019-07-11 NOTE — Progress Notes (Signed)
GYNECOLOGY OFFICE VISIT NOTE  History:   Elizabeth Holland [id number] here today for annual well woman exam. She reports a history of abnormal pap smears and bilateral ovarian cysts.  Patient reports that she intermittently experiences unilateral "mild pulsating pain" that is intermittent and is improved with ibuprofen dosing.  She denies abnormal vaginal discharge, bleeding, or pelvic pain.   She states she is sexually active and has had one partner in the last year.  She endorses condom usage and states it is about 50% of the time.  She reports desire for pregnancy in 1-2 years.  Patient denies pain or discomfort with sexual activity and reports sexual activity 2 days ago. She requests STD testing.  She reports breast exam every 2-3 months.  She reports that her maternal grandmother had breast cancer, but is still alive.    She reports having a PCP in [place] ([facility]) and was last seen in March 2020. She states that she does not regularly exercise, but did just get a membership to a gym.  She reports occasional/socal alcohol usage.  She denies tobacco and illicit drug usage. 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     Past Medical History:  Diagnosis Date  . Anemia   . Bilateral ovarian cysts     History reviewed. No pertinent surgical history.  The following portions of the patient's history were reviewed and updated as appropriate: allergies, current medications, past family history, past medical history, past social history, past surgical history and problem list.   Health Maintenance:  ASCUS pap and Positive HRHPV in 2020.  No mammogram history d/t age.   Review of Systems:  Pertinent items noted in HPI and remainder of comprehensive ROS otherwise negative.    Objective:    Physical Exam BP 109/73   Pulse 68   Ht 5\' 2"  (1.575 m)   Wt 187 lb (84.8  kg)   LMP 07/02/2019 (Exact Date)   BMI 34.20 kg/m  Physical Exam Exam conducted with a chaperone present.  Constitutional:      Appearance: Normal appearance. She is obese.  HENT:     Head: Normocephalic and atraumatic.  Eyes:     Conjunctiva/sclera: Conjunctivae normal.  Neck:     Thyroid: No thyroid mass, thyromegaly or thyroid tenderness.  Cardiovascular:     Rate and Rhythm: Normal rate and regular rhythm.     Heart sounds: Normal heart sounds.  Pulmonary:     Effort: Pulmonary effort is normal.  Chest:     Breasts:        Right: Normal. No inverted nipple, mass, nipple discharge or tenderness.        Left: Normal. No inverted nipple, mass, nipple discharge or tenderness.  Abdominal:     General: Bowel sounds are normal.     Palpations: Abdomen is soft.     Tenderness: There is no abdominal tenderness.  Genitourinary:    Labia:        Right: No tenderness or lesion.        Left: No tenderness or lesion.      Vagina: Vaginal discharge (Scant amt-white) present. No bleeding.     Cervix: No cervical motion tenderness, discharge, friability, lesion or erythema.     Uterus: Normal. Not enlarged.      Comments: Pap collected via brush and spatula GC/CT and  CV collected  UTA adnexa d/t body habitus Musculoskeletal:        General: Normal range of motion.     Cervical back: Normal range of motion.     Right lower leg: No edema.     Left lower leg: No edema.  Lymphadenopathy:     Upper Body:     Right upper body: No axillary adenopathy.     Left upper body: No axillary adenopathy.  Skin:    General: Skin is warm and dry.  Neurological:     Mental Status: She is alert and oriented to person, place, and time.  Psychiatric:        Mood and Affect: Mood normal.        Behavior: Behavior normal.        Thought Content: Thought content normal.        Judgment: Judgment normal.      Labs and Imaging No results found for this or any previous visit (from the past 168  hour(s)). No results found.   Assessment & Plan:       1. Encounter for annual routine gynecological examination -Exam findings discussed. -Educated on ASCCP guidelines regarding pap smear evaluation and frequency. -Informed of turnover time and provider/clinic policy on releasing results. -Encouraged to activate Mychart. -Educated on AHA exercise recommendations of 30 minutes of moderate to vigorous activity at least 5x/week. -Educated and encouraged to initiate monthly SBE with increased breast awareness including examination of breast for skin changes, moles, tenderness, etc.  -Encouraged to initiate usage of PNV daily with infrequent condom usage and increased risk for unplanned pregnancy.  -Discussed ovarian cysts including s/s, frequency, management, and precautions r/t hemorrhage.  2. Screening for cervical cancer Pap collected - Cytology - PAP( [place])  3. Screening for STD (sexually transmitted disease) -Ordered -Cultures collected  - Cervicovaginal ancillary only( [place]) - Hepatitis B surface antigen - Hepatitis C antibody - HIV antibody - RPR  4. History of abnormal cervical Papanicolaou smear -Reviewed abnormal history. -Discussed management for abnormal vs normal result with pap today.    Routine preventative health maintenance measures emphasized. Please refer to After Visit Summary for other counseling recommendations.   No follow-ups on file.      Maryann Conners, CNM 07/11/2019

## 2019-07-11 NOTE — Progress Notes (Signed)
Patient presents for Annual Exam  LMP: 07/02/19 Last pap:2019 abnormal per pt + HPV  Contraception: None STD Screening:full panel Family Hx of Breast Cancer: None  CC: Rule out cyst pt notes pain on both side  denies pain w/ intercourse no dysuria

## 2019-07-13 ENCOUNTER — Telehealth: Payer: Self-pay

## 2019-07-13 LAB — CERVICOVAGINAL ANCILLARY ONLY
Bacterial Vaginitis (gardnerella): POSITIVE — AB
Candida Glabrata: NEGATIVE
Candida Vaginitis: POSITIVE — AB
Chlamydia: NEGATIVE
Comment: NEGATIVE
Comment: NEGATIVE
Comment: NEGATIVE
Comment: NEGATIVE
Comment: NEGATIVE
Comment: NORMAL
Neisseria Gonorrhea: NEGATIVE
Trichomonas: NEGATIVE

## 2019-07-13 LAB — CYTOLOGY - PAP: Diagnosis: NEGATIVE

## 2019-07-13 MED ORDER — METRONIDAZOLE 500 MG PO TABS: 500.0000 mg | ORAL_TABLET | Freq: Two times a day (BID) | ORAL | 0 refills | Status: DC

## 2019-07-13 MED ORDER — FLUCONAZOLE 150 MG PO TABS: 150.0000 mg | ORAL_TABLET | Freq: Once | ORAL | 0 refills | Status: AC

## 2019-07-13 NOTE — Telephone Encounter (Signed)
Patient called for explanation of her lab results. Her labs just resulted and had not been reviewed yet by provider.  Notified patient of BV and yeast.  Rx routed to pharmacy per protocol.

## 2019-09-02 ENCOUNTER — Ambulatory Visit: Payer: BC Managed Care – PPO | Attending: Internal Medicine

## 2019-09-02 DIAGNOSIS — Z23 Encounter for immunization: Secondary | ICD-10-CM

## 2019-09-02 NOTE — Progress Notes (Signed)
   Covid-19 Vaccination Clinic  Name:  Elizabeth Holland    MRN: [id number] DOB: [birth year]  09/02/2019  [name] was observed post Covid-19 immunization for 15 minutes without incident. She was provided with Vaccine Information Sheet and instruction to access the V-Safe system.   [name] was instructed to call 911 with any severe reactions post vaccine: Marland Kitchen Difficulty breathing  . Swelling of face and throat  . A fast heartbeat  . A bad rash all over body  . Dizziness and weakness   Immunizations Administered    Name Date Dose VIS Date Route   Pfizer COVID-19 Vaccine 09/02/2019 12:18 PM 0.3 mL 06/09/2019 Intramuscular   Manufacturer: [employer], [employer]   Lot: [id number]   NDC: [address]-3

## 2019-09-23 ENCOUNTER — Ambulatory Visit: Payer: BC Managed Care – PPO

## 2019-09-27 ENCOUNTER — Ambulatory Visit: Payer: BC Managed Care – PPO | Attending: Internal Medicine

## 2019-09-27 DIAGNOSIS — Z23 Encounter for immunization: Secondary | ICD-10-CM

## 2019-09-27 NOTE — Progress Notes (Signed)
   Covid-19 Vaccination Clinic  Name:  Bethanny Toelle    MRN: [id number] DOB: Nov 06, 1995  09/27/2019  Ms. Stroupe was observed post Covid-19 immunization for 15 minutes without incident. She was provided with Vaccine Information Sheet and instruction to access the V-Safe system.   Ms. Tunnell was instructed to call 911 with any severe reactions post vaccine: Marland Kitchen Difficulty breathing  . Swelling of face and throat  . A fast heartbeat  . A bad rash all over body  . Dizziness and weakness   Immunizations Administered    Name Date Dose VIS Date Route   Pfizer COVID-19 Vaccine 09/27/2019  2:26 PM 0.3 mL 06/09/2019 Intramuscular   Manufacturer: [employer], [employer]   Lot: [id number]   NDC: [address]-7

## 2019-10-18 ENCOUNTER — Telehealth: Payer: BC Managed Care – PPO

## 2020-03-04 ENCOUNTER — Emergency Department (HOSPITAL_COMMUNITY)
Admission: EM | Admit: 2020-03-04 | Discharge: 2020-03-04 | Disposition: A | Discharge status: Home / Self Care | Payer: BC Managed Care – PPO | Attending: Emergency Medicine | Admitting: Emergency Medicine

## 2020-03-04 ENCOUNTER — Other Ambulatory Visit: Payer: Self-pay

## 2020-03-04 ENCOUNTER — Encounter (HOSPITAL_COMMUNITY): Payer: Self-pay

## 2020-03-04 DIAGNOSIS — J029 Acute pharyngitis, unspecified: Secondary | ICD-10-CM | POA: Diagnosis present

## 2020-03-04 DIAGNOSIS — J069 Acute upper respiratory infection, unspecified: Secondary | ICD-10-CM | POA: Diagnosis not present

## 2020-03-04 DIAGNOSIS — Z20822 Contact with and (suspected) exposure to covid-19: Secondary | ICD-10-CM | POA: Insufficient documentation

## 2020-03-04 LAB — SARS CORONAVIRUS 2 BY RT PCR (HOSPITAL ORDER, PERFORMED IN ~~LOC~~ HOSPITAL LAB): SARS Coronavirus 2: NEGATIVE

## 2020-03-04 LAB — GROUP A STREP BY PCR: Group A Strep by PCR: NOT DETECTED

## 2020-03-04 MED ORDER — CETIRIZINE HCL 10 MG PO TABS: 10.0000 mg | ORAL_TABLET | Freq: Every day | ORAL | 0 refills | Status: AC

## 2020-03-04 MED ORDER — FLUTICASONE PROPIONATE 50 MCG/ACT NA SUSP: 2.0000 | Freq: Every day | NASAL | 0 refills | Status: AC

## 2020-03-04 MED ORDER — LIDOCAINE VISCOUS HCL 2 % MT SOLN
15.0000 mL | Freq: Once | OROMUCOSAL | Status: AC
  Administered 2020-03-04: 15 mL via OROMUCOSAL
  Filled 2020-03-04: qty 15

## 2020-03-04 NOTE — Discharge Instructions (Signed)
Take the medications as prescribed.  Your Strep test and COVID test is negative  Return for new or worsening symptoms

## 2020-03-04 NOTE — ED Triage Notes (Signed)
Patient c/o sore throat and nasal congestion x 2 days. 

## 2020-03-04 NOTE — ED Provider Notes (Signed)
[facility]-EMERGENCY DEPT Provider Note   CSN: [id number] Arrival date & time: 03/04/20  1310    History Chief Complaint  Patient presents with  . Sore Throat  . Nasal Congestion    Elizabeth Holland is a 24 y.o. female with past medical history who presents for evaluation of sore throat. Patient has had a scratchy throat over the last 2 days. States she looked at the posterior aspect of her throat yesterday and thought she had white plaques on it. She has been tolerating p.o. intake without difficulty. Has had some congestion and rhinorrhea. She did at home Covid test yesterday which was negative. No known Covid exposures. She is Covid vaccinated.  Denies headache, lightheadedness, dizziness, chest pain, shortness of breath, cough, myalgias, rashes, lesions.  Is able to tolerate p.o. intake at home without difficulty.  Denies additional aggravating or alleviating factors.  History obtained from patient and past medical records.  No nterpreter used.  HPI     Past Medical History:  Diagnosis Date  . Anemia   . Bilateral ovarian cysts     Patient Active Problem List   Diagnosis Date Noted  . History of abnormal cervical Papanicolaou smear 07/11/2019    History reviewed. No pertinent surgical history.   OB History    Gravida  0   Para  0   Term  0   Preterm  0   AB  0   Living  0     SAB  0   TAB  0   Ectopic  0   Multiple  0   Live Births  0           Family History  Problem Relation Age of Onset  . High Cholesterol Mother     Social History   Tobacco Use  . Smoking status: Never Smoker  . Smokeless tobacco: Never Used  Vaping Use  . Vaping Use: Never used  Substance Use Topics  . Alcohol use: Yes    Comment: occasional  . Drug use: No    Home Medications Prior to Admission medications   Medication Sig Start Date End Date Taking? Authorizing Provider  cetirizine (ZYRTEC ALLERGY) 10 MG tablet Take 1 tablet (10 mg total)  by mouth daily. 03/04/20   Shell Blanchette A, PA-C  fluticasone (FLONASE) 50 MCG/ACT nasal spray Place 2 sprays into both nostrils daily. 03/04/20   Neela Zecca A, PA-C  ibuprofen (ADVIL,MOTRIN) 200 MG tablet Take 800 mg by mouth daily as needed for moderate pain.    [provider]  metroNIDAZOLE (FLAGYL) 500 MG tablet Take 1 tablet (500 mg total) by mouth 2 (two) times daily. 07/13/19   Gerrit Heck, CNM    Allergies    Amoxicillin  Review of Systems   Review of Systems  Constitutional: Negative.   HENT: Positive for congestion, postnasal drip, rhinorrhea and sore throat. Negative for drooling, ear pain, facial swelling, mouth sores, nosebleeds, sinus pressure, sinus pain, sneezing, tinnitus, trouble swallowing and voice change.   Respiratory: Negative.   Cardiovascular: Negative.   Gastrointestinal: Negative.   Genitourinary: Negative.   Musculoskeletal: Negative.   Skin: Negative.   Neurological: Negative.   All other systems reviewed and are negative.   Physical Exam Updated Vital Signs BP 122/72 (BP Location: Left Arm)   Pulse 86   Temp 98.1 F (36.7 C) (Oral)   Resp 16   Ht 5\' 2"  (1.575 m)   Wt 81.6 kg   LMP 02/28/2020  SpO2 99%   BMI 32.92 kg/m   Physical Exam Vitals and nursing note reviewed.  Constitutional:      General: She is not in acute distress.    Appearance: She is not ill-appearing, toxic-appearing or diaphoretic.  HENT:     Head: Normocephalic and atraumatic.     Jaw: There is normal jaw occlusion.     Right Ear: Tympanic membrane, ear canal and external ear normal. There is no impacted cerumen. No hemotympanum. Tympanic membrane is not injected, scarred, perforated, erythematous, retracted or bulging.     Left Ear: Tympanic membrane, ear canal and external ear normal. There is no impacted cerumen. No hemotympanum. Tympanic membrane is not injected, scarred, perforated, erythematous, retracted or bulging.     Ears:     Comments: No  Mastoid tenderness.    Nose:     Comments: Clear rhinorrhea and congestion to bilateral nares.  No sinus tenderness.    Mouth/Throat:     Lips: Pink.     Mouth: Mucous membranes are moist.     Pharynx: Oropharynx is clear. Uvula midline.     Tonsils: No tonsillar exudate or tonsillar abscesses. 1+ on the right. 1+ on the left.     Comments: Posterior oropharynx with mild erythema.  Mucous membranes moist.  Tonsils without exudate.  Uvula midline without deviation.  No evidence of PTA or RPA.  No drooling, dysphasia or trismus.  Phonation normal. Neck:     Trachea: Trachea and phonation normal.     Meningeal: Brudzinski's sign and Kernig's sign absent.     Comments: No Neck stiffness or neck rigidity.  No meningismus.  No cervical lymphadenopathy. Cardiovascular:     Comments: No murmurs rubs or gallops. Pulmonary:     Comments: Clear to auscultation bilaterally without wheeze, rhonchi or rales.  No accessory muscle usage.  Able speak in full sentences. Abdominal:     Comments: Soft, nontender without rebound or guarding.  No CVA tenderness.  Musculoskeletal:     Comments: Moves all 4 extremities without difficulty.  Lower extremities without edema, erythema or warmth.  Skin:    Comments: Brisk capillary refill.  No rashes or lesions.  Neurological:     Mental Status: She is alert.     Comments: Ambulatory in department without difficulty.  Cranial nerves II through XII grossly intact.  No facial droop.  No aphasia.    ED Results / Procedures / Treatments   Labs (all labs ordered are listed, but only abnormal results are displayed) Labs Reviewed  GROUP A STREP BY PCR  SARS CORONAVIRUS 2 BY RT PCR (HOSPITAL ORDER, PERFORMED IN [facility])    EKG None  Radiology No results found.  Procedures Procedures (including critical care time)  Medications Ordered in ED Medications  lidocaine (XYLOCAINE) 2 % viscous mouth solution 15 mL (15 mLs Mouth/Throat Given 03/04/20  1421)   ED Course  I have reviewed the triage vital signs and the nursing notes.  Pertinent labs & imaging results that were available during my care of the patient were reviewed by me and considered in my medical decision making (see chart for details).  24 year old presents for evaluation of sore throat.  She is afebrile, nonseptic, non-ill-appearing.  Associated congestion and rhinorrhea.  She is Covid vaccinated.  Appears otherwise well.  Mild posterior oropharynx erythema however tonsils 1+ bilaterally with some mild erythema however no exudate.  Uvula midline without deviation.  No evidence of PTA or RPA.  No drooling, dysphagia or trismus.  Phonation normal.  Heart lungs clear.  Abdomen soft, nontender.  No lymphadenopathy.  Patient's Covid and strep negative.  Likely viral pharyngitis.  Discussed symptomatic management.  Tolerating p.o. intake here in ED without difficulty.  Low suspicion for acute bacterial source of infection.  Discussed strict return precautions.  Patient voiced understanding and is agreeable for follow-up.  The patient has been appropriately medically screened and/or stabilized in the ED. I have low suspicion for any other emergent medical condition which would require further screening, evaluation or treatment in the ED or require inpatient management.  Patient is hemodynamically stable and in no acute distress.  Patient able to ambulate in department prior to ED.  Evaluation does not show acute pathology that would require ongoing or additional emergent interventions while in the emergency department or further inpatient treatment.  I have discussed the diagnosis with the patient and answered all questions.  Pain is been managed while in the emergency department and patient has no further complaints prior to discharge.  Patient is comfortable with plan discussed in room and is stable for discharge at this time.  I have discussed strict return precautions for returning to the  emergency department.  Patient was encouraged to follow-up with PCP/specialist refer to at discharge.    MDM Rules/Calculators/A&P                          Elizabeth Holland was evaluated in Emergency Department on 03/04/2020 for the symptoms described in the history of present illness. She was evaluated in the context of the global COVID-19 pandemic, which necessitated consideration that the patient might be at risk for infection with the SARS-CoV-2 virus that causes COVID-19. Institutional protocols and algorithms that pertain to the evaluation of patients at risk for COVID-19 are in a state of rapid change based on information released by regulatory bodies including the CDC and federal and state organizations. These policies and algorithms were followed during the patient's care in the ED.  Final Clinical Impression(s) / ED Diagnoses Final diagnoses:  Viral upper respiratory tract infection    Rx / DC Orders ED Discharge Orders         Ordered    fluticasone (FLONASE) 50 MCG/ACT nasal spray  Daily        03/04/20 1516    cetirizine (ZYRTEC ALLERGY) 10 MG tablet  Daily        03/04/20 1516           Athel Merriweather A, PA-C 03/04/20 1527    Bethann Berkshire, MD 03/05/20 [phone]

## 2020-04-15 ENCOUNTER — Other Ambulatory Visit: Payer: Self-pay | Admitting: Obstetrics

## 2020-04-15 ENCOUNTER — Other Ambulatory Visit (HOSPITAL_COMMUNITY)
Admission: RE | Admit: 2020-04-15 | Discharge: 2020-04-15 | Disposition: A | Discharge status: Home / Self Care | Payer: BC Managed Care – PPO | Source: Ambulatory Visit | Attending: Obstetrics | Admitting: Obstetrics

## 2020-04-15 ENCOUNTER — Encounter: Payer: Self-pay | Admitting: Obstetrics

## 2020-04-15 ENCOUNTER — Ambulatory Visit: Payer: BC Managed Care – PPO | Admitting: Obstetrics

## 2020-04-15 ENCOUNTER — Other Ambulatory Visit: Payer: Self-pay

## 2020-04-15 VITALS — BP 119/74 | HR 86 | Wt 191.0 lb

## 2020-04-15 DIAGNOSIS — N898 Other specified noninflammatory disorders of vagina: Secondary | ICD-10-CM | POA: Insufficient documentation

## 2020-04-15 DIAGNOSIS — R102 Pelvic and perineal pain: Secondary | ICD-10-CM

## 2020-04-15 MED ORDER — IBUPROFEN 800 MG PO TABS: 800.0000 mg | ORAL_TABLET | Freq: Three times a day (TID) | ORAL | 5 refills | Status: AC | PRN

## 2020-04-15 NOTE — Progress Notes (Signed)
Pt is having abd and pelvic pain.  Pt states LMP 9/29. Pt is not on any birth control.  Pt denies irregular bleeding, dicharge, pain with intercourse.

## 2020-04-15 NOTE — Progress Notes (Signed)
Patient ID: Elizabeth Holland, female   DOB: December 04, 1995, 24 y.o.   MRN: [id number]  Chief Complaint  Patient presents with  . Abdominal Pain    HPI Elizabeth Holland is a 24 y.o. female.  Complains of pelvic pain for the past 2 weeks.  Denies dysuria.  She does have some discharge. HPI  Past Medical History:  Diagnosis Date  . Anemia   . Bilateral ovarian cysts     No past surgical history on file.  Family History  Problem Relation Age of Onset  . High Cholesterol Mother     Social History Social History   Tobacco Use  . Smoking status: Never Smoker  . Smokeless tobacco: Never Used  Vaping Use  . Vaping Use: Never used  Substance Use Topics  . Alcohol use: Yes    Comment: occasional  . Drug use: No    Allergies  Allergen Reactions  . Amoxicillin Hives    Has patient had a PCN reaction causing immediate rash, facial/tongue/throat swelling, SOB or lightheadedness with hypotension: Unknown Has patient had a PCN reaction causing severe rash involving mucus membranes or skin necrosis: No Has patient had a PCN reaction that required hospitalization: No Has patient had a PCN reaction occurring within the last 10 years: No If all of the above answers are "NO", then may proceed with Cephalosporin use.     Current Outpatient Medications  Medication Sig Dispense Refill  . cetirizine (ZYRTEC ALLERGY) 10 MG tablet Take 1 tablet (10 mg total) by mouth daily. 30 tablet 0  . fluticasone (FLONASE) 50 MCG/ACT nasal spray Place 2 sprays into both nostrils daily. 11.1 mL 0  . ibuprofen (ADVIL,MOTRIN) 200 MG tablet Take 800 mg by mouth daily as needed for moderate pain.     No current facility-administered medications for this visit.    Review of Systems Review of Systems Constitutional: negative for fatigue and weight loss Respiratory: negative for cough and wheezing Cardiovascular: negative for chest pain, fatigue and palpitations Gastrointestinal: negative for abdominal pain and  change in bowel habits Genitourinary: positive for pelvic pain Integument/breast: negative for nipple discharge Musculoskeletal:negative for myalgias Neurological: negative for gait problems and tremors Behavioral/Psych: negative for abusive relationship, depression Endocrine: negative for temperature intolerance      Blood pressure 119/74, pulse 86, weight 191 lb (86.6 kg), last menstrual period 03/27/2020.  Physical Exam Physical Exam           General: Alert and no distress Abdomen:  normal findings: no organomegaly, soft, non-tender and no hernia  Pelvis:  External genitalia: normal general appearance Urinary system: urethral meatus normal and bladder without fullness, nontender Vaginal: normal without tenderness, induration or masses Cervix: normal appearance Adnexa: normal bimanual exam Uterus: anteverted and non-tender, normal size    50% of 15 min visit spent on counseling and coordination of care.   Data Reviewed Labs Wet Prep  Assessment     1. Pelvic pain Rx: - US PELVIC COMPLETE WITH TRANSVAGINAL; Future - ibuprofen (ADVIL) 800 MG tablet; Take 1 tablet (800 mg total) by mouth every 8 (eight) hours as needed.  Dispense: 30 tablet; Refill: 5  2. Vaginal discharge Rx: - Cervicovaginal ancillary only( [facility])    Plan  Follow up in 2 weeks    Brock Bad, MD 04/15/2020 3:23 PM

## 2020-04-16 ENCOUNTER — Other Ambulatory Visit: Payer: Self-pay | Admitting: Obstetrics

## 2020-04-16 DIAGNOSIS — B9689 Other specified bacterial agents as the cause of diseases classified elsewhere: Secondary | ICD-10-CM

## 2020-04-16 LAB — CERVICOVAGINAL ANCILLARY ONLY
Bacterial Vaginitis (gardnerella): POSITIVE — AB
Candida Glabrata: NEGATIVE
Candida Vaginitis: NEGATIVE
Chlamydia: NEGATIVE
Comment: NEGATIVE
Comment: NEGATIVE
Comment: NEGATIVE
Comment: NEGATIVE
Comment: NEGATIVE
Comment: NORMAL
Neisseria Gonorrhea: NEGATIVE
Trichomonas: NEGATIVE

## 2020-04-16 MED ORDER — METRONIDAZOLE 500 MG PO TABS: 500.0000 mg | ORAL_TABLET | Freq: Two times a day (BID) | ORAL | 2 refills | Status: AC

## 2020-04-25 ENCOUNTER — Ambulatory Visit: Admission: RE | Admit: 2020-04-25 | Payer: BC Managed Care – PPO | Source: Ambulatory Visit

## 2020-05-06 ENCOUNTER — Telehealth: Payer: BC Managed Care – PPO | Admitting: Obstetrics

## 2020-05-08 ENCOUNTER — Ambulatory Visit
Admission: RE | Admit: 2020-05-08 | Discharge: 2020-05-08 | Disposition: A | Discharge status: Home / Self Care | Payer: BC Managed Care – PPO | Source: Ambulatory Visit | Attending: Obstetrics | Admitting: Obstetrics

## 2020-05-08 ENCOUNTER — Encounter: Payer: Self-pay | Admitting: Obstetrics

## 2020-05-08 ENCOUNTER — Telehealth (INDEPENDENT_AMBULATORY_CARE_PROVIDER_SITE_OTHER): Payer: BC Managed Care – PPO | Admitting: Obstetrics

## 2020-05-08 ENCOUNTER — Other Ambulatory Visit: Payer: Self-pay

## 2020-05-08 DIAGNOSIS — R102 Pelvic and perineal pain: Secondary | ICD-10-CM

## 2020-05-08 MED ORDER — CLOTRIMAZOLE 1 % EX CREA: 1.0000 "application " | TOPICAL_CREAM | Freq: Two times a day (BID) | CUTANEOUS | 1 refills | Status: DC

## 2020-05-08 NOTE — Progress Notes (Signed)
GYNECOLOGY VIRTUAL VISIT ENCOUNTER NOTE  Provider location: Center for [facility] [place]   I connected with Ernst Spell on 05/08/20 at  4:00 PM EST by MyChart Video Encounter at home and verified that I am speaking with the correct person using two identifiers.   I discussed the limitations, risks, security and privacy concerns of performing an evaluation and management service virtually and the availability of in person appointments. I also discussed with the patient that there may be a patient responsible charge related to this service. The patient expressed understanding and agreed to proceed.   History:  Elizabeth Holland is a 23 y.o. G0P0000 female being evaluated today for ultrasound results.  She has a history of pelvic pain, resolved. She denies any abnormal vaginal discharge, bleeding, or other concerns.       Past Medical History:  Diagnosis Date  . Anemia   . Bilateral ovarian cysts    History reviewed. No pertinent surgical history. The following portions of the patient's history were reviewed and updated as appropriate: allergies, current medications, past family history, past medical history, past social history, past surgical history and problem list.   Health Maintenance:  Normal pap and negative HRHPV on 07-11-2019.    Review of Systems:  Pertinent items noted in HPI and remainder of comprehensive ROS otherwise negative.  Physical Exam:   General:  Alert, oriented and cooperative. Patient appears to be in no acute distress.  Mental Status: Normal mood and affect. Normal behavior. Normal judgment and thought content.   Respiratory: Normal respiratory effort, no problems with respiration noted  Rest of physical exam deferred due to type of encounter  Labs and Imaging No results found for this or any previous visit (from the past 336 hour(s)). US PELVIC COMPLETE WITH TRANSVAGINAL  Result Date: 05/08/2020 CLINICAL DATA:  Pelvic pain RIGHT greater than  LEFT, LMP 04/22/2020; history of ovarian cysts EXAM: TRANSABDOMINAL AND TRANSVAGINAL ULTRASOUND OF PELVIS TECHNIQUE: Both transabdominal and transvaginal ultrasound examinations of the pelvis were performed. Transabdominal technique was performed for global imaging of the pelvis including uterus, ovaries, adnexal regions, and pelvic cul-de-sac. It was necessary to proceed with endovaginal exam following the transabdominal exam to visualize the lower uterine segment and ovaries. COMPARISON:  None FINDINGS: Uterus Measurements: 7.6 x 4.1 x 4.7 cm = volume: 76 mL. Mildly anteflexed. Heterogeneous myometrial echogenicity. Normal morphology with small LEFT anterior subserosal leiomyoma 2.0 x 1.8 x 1.9 cm. No additional masses. Endometrium Thickness: 11 mm. Heterogeneous. No focal mass or endometrial fluid. Right ovary Measurements: 3.8 x 1.6 x 2.0 cm = volume: 6.5 mL. Normal morphology without mass Left ovary Measurements: 4.5 x 2.4 x 3.7 cm = volume: 20.4 mL. Small hemorrhagic corpus luteum 2.7 cm greatest size; no follow-up imaging recommended. No additional masses. Other findings Small amount of nonspecific free pelvic fluid. No additional adnexal masses. IMPRESSION: LEFT anterior subserosal leiomyoma 2.0 cm greatest size. Small amount of nonspecific free pelvic fluid. Otherwise negative exam. Electronically Signed   By: Ulyses Southward M.D.   On: 05/08/2020 09:00       Assessment and Plan:     1. Pelvic pain - ultrasound WNL's except for small subserosal fibroid - will follow prn any recurrence of pain and  she will call the office - Ibuprofen prn  I discussed the assessment and treatment plan with the patient. The patient was provided an opportunity to ask questions and all were answered. The patient agreed with the plan and demonstrated an  understanding of the instructions.   The patient was advised to call back or seek an in-person evaluation/go to the ED if the symptoms worsen or if the condition fails to  improve as anticipated.  I provided 15 minutes of face-to-face time during this encounter.   Coral Ceo, MD Center for [facility], [facility] 05/08/20

## 2020-07-11 ENCOUNTER — Emergency Department (HOSPITAL_COMMUNITY)
Admission: EM | Admit: 2020-07-11 | Discharge: 2020-07-11 | Disposition: A | Discharge status: Home / Self Care | Payer: BC Managed Care – PPO | Attending: Emergency Medicine | Admitting: Emergency Medicine

## 2020-07-11 ENCOUNTER — Encounter (HOSPITAL_COMMUNITY): Payer: Self-pay

## 2020-07-11 ENCOUNTER — Other Ambulatory Visit: Payer: Self-pay

## 2020-07-11 ENCOUNTER — Emergency Department (HOSPITAL_COMMUNITY): Payer: BC Managed Care – PPO

## 2020-07-11 DIAGNOSIS — Z5321 Procedure and treatment not carried out due to patient leaving prior to being seen by health care provider: Secondary | ICD-10-CM | POA: Insufficient documentation

## 2020-07-11 DIAGNOSIS — R519 Headache, unspecified: Secondary | ICD-10-CM | POA: Insufficient documentation

## 2020-07-11 DIAGNOSIS — R110 Nausea: Secondary | ICD-10-CM | POA: Insufficient documentation

## 2020-07-11 DIAGNOSIS — R079 Chest pain, unspecified: Secondary | ICD-10-CM | POA: Insufficient documentation

## 2020-07-11 LAB — TROPONIN I (HIGH SENSITIVITY): Troponin I (High Sensitivity): <2 ng/L (ref <18)

## 2020-07-11 LAB — CBC
HCT: 37.7 % (ref 36.0–46.0)
Hemoglobin: 11.6 g/dL — ABNORMAL LOW (ref 12.0–15.0)
MCH: 26.1 pg (ref 26.0–34.0)
MCHC: 30.8 g/dL (ref 30.0–36.0)
MCV: 84.7 fL (ref 80.0–100.0)
Platelets: 273 10*3/uL (ref 150–400)
RBC: 4.45 MIL/uL (ref 3.87–5.11)
RDW: 14 % (ref 11.5–15.5)
WBC: 5.6 10*3/uL (ref 4.0–10.5)
nRBC: 0 % (ref 0.0–0.2)

## 2020-07-11 LAB — BASIC METABOLIC PANEL
Anion gap: 8 (ref 5–15)
BUN: 11 mg/dL (ref 6–20)
CO2: 27 mmol/L (ref 22–32)
Calcium: 9.3 mg/dL (ref 8.9–10.3)
Chloride: 103 mmol/L (ref 98–111)
Creatinine, Ser: 0.71 mg/dL (ref 0.44–1.00)
GFR, Estimated: >60 mL/min (ref >60)
Glucose, Bld: 94 mg/dL (ref 70–99)
Potassium: 4.1 mmol/L (ref 3.5–5.1)
Sodium: 138 mmol/L (ref 135–145)

## 2020-07-11 LAB — I-STAT BETA HCG BLOOD, ED (MC, WL, AP ONLY): I-stat hCG, quantitative: <5 m[IU]/mL (ref <5)

## 2020-07-11 NOTE — ED Notes (Signed)
Pt informed staff that she is leaving

## 2020-07-11 NOTE — ED Triage Notes (Signed)
Patient c/o headache x 1 week.  Patient c/o mid/left chest pain since 0800 this morning. Patient states she had nausea when chest pain started. Patient denies any SOB, diaphoresis, or vomiting.

## 2020-07-24 ENCOUNTER — Other Ambulatory Visit (HOSPITAL_COMMUNITY)
Admission: RE | Admit: 2020-07-24 | Discharge: 2020-07-24 | Disposition: A | Discharge status: Home / Self Care | Payer: BC Managed Care – PPO | Source: Ambulatory Visit | Attending: Women's Health | Admitting: Women's Health

## 2020-07-24 ENCOUNTER — Ambulatory Visit (INDEPENDENT_AMBULATORY_CARE_PROVIDER_SITE_OTHER): Payer: BC Managed Care – PPO | Admitting: Certified Nurse Midwife

## 2020-07-24 ENCOUNTER — Encounter: Payer: Self-pay | Admitting: Certified Nurse Midwife

## 2020-07-24 ENCOUNTER — Other Ambulatory Visit: Payer: Self-pay

## 2020-07-24 VITALS — BP 125/71 | HR 82 | Ht 62.0 in | Wt 187.0 lb

## 2020-07-24 DIAGNOSIS — Z113 Encounter for screening for infections with a predominantly sexual mode of transmission: Secondary | ICD-10-CM | POA: Diagnosis not present

## 2020-07-24 DIAGNOSIS — Z3141 Encounter for fertility testing: Secondary | ICD-10-CM

## 2020-07-24 DIAGNOSIS — Z01419 Encounter for gynecological examination (general) (routine) without abnormal findings: Secondary | ICD-10-CM | POA: Diagnosis not present

## 2020-07-24 NOTE — Progress Notes (Signed)
Pt would like to discuss conception, have been trying for a year. Pt states she is having regular cycles. Pt is taking daily PNV.

## 2020-07-24 NOTE — Progress Notes (Signed)
Gynecology Annual Exam   History of Present Illness: Elizabeth Holland is a 25 y.o. female presenting for an annual exam. She has been trying to conceive for 11 consecutive months. She reports regular timed IC. Reports 25-26 day cycles with ovulation detection on day 13. She reports her partner has Type I DM and had Covid last year which "messed him up". She denies dyspareunia. She does perform self breast exams. There is no notable family history of breast or ovarian cancer in her family. She would like STD screen.  Past Medical History:  Past Medical History:  Diagnosis Date  . Anemia   . Bilateral ovarian cysts     Past Surgical History:  History reviewed. No pertinent surgical history.  Gynecologic History:  LMP: Patient's last menstrual period was 07/04/2020. Average Interval: regular Heavy Menses: no Clots: no Intermenstrual Bleeding: no Postcoital Bleeding: no Dysmenorrhea: no Contraception: none Last Pap: completed on 07/11/19 ; result was: no abnormalities  Mammogram: n/a  Obstetric History: G0P0000  Family History:  Family History  Problem Relation Age of Onset  . High Cholesterol Mother     Social History:  Social History   Socioeconomic History  . Marital status: Significant Other    Spouse name: Not on file  . Number of children: Not on file  . Years of education: Not on file  . Highest education level: Not on file  Occupational History  . Not on file  Tobacco Use  . Smoking status: Never Smoker  . Smokeless tobacco: Never Used  Vaping Use  . Vaping Use: Never used  Substance and Sexual Activity  . Alcohol use: Yes    Comment: occasional  . Drug use: No  . Sexual activity: Yes    Birth control/protection: None  Other Topics Concern  . Not on file  Social History Narrative  . Not on file   Social Determinants of Health   Financial Resource Strain: Not on file  Food Insecurity: Not on file  Transportation Needs: Not on file  Physical Activity:  Not on file  Stress: Not on file  Social Connections: Not on file  Intimate Partner Violence: Not on file    Allergies:  Allergies  Allergen Reactions  . Amoxicillin Hives    Has patient had a PCN reaction causing immediate rash, facial/tongue/throat swelling, SOB or lightheadedness with hypotension: Unknown Has patient had a PCN reaction causing severe rash involving mucus membranes or skin necrosis: No Has patient had a PCN reaction that required hospitalization: No Has patient had a PCN reaction occurring within the last 10 years: No If all of the above answers are "NO", then may proceed with Cephalosporin use.     Medications: Prior to Admission medications   Medication Sig Start Date End Date Taking? Authorizing Provider  cetirizine (ZYRTEC ALLERGY) 10 MG tablet Take 1 tablet (10 mg total) by mouth daily. 03/04/20   Henderly, Britni A, PA-C  fluticasone (FLONASE) 50 MCG/ACT nasal spray Place 2 sprays into both nostrils daily. 03/04/20   Henderly, Britni A, PA-C  ibuprofen (ADVIL) 800 MG tablet Take 1 tablet (800 mg total) by mouth every 8 (eight) hours as needed. 04/15/20   Brock Bad, MD  ibuprofen (ADVIL,MOTRIN) 200 MG tablet Take 800 mg by mouth daily as needed for moderate pain.    [provider]  metroNIDAZOLE (FLAGYL) 500 MG tablet Take 1 tablet (500 mg total) by mouth 2 (two) times daily. 04/16/20   Brock Bad, MD    Review  of Systems: negative except noted in HPI  Physical Exam Vitals: BP 125/71   Pulse 82   Ht 5\' 2"  (1.575 m)   Wt 187 lb (84.8 kg)   LMP 07/04/2020   BMI 34.20 kg/m  General: NAD HEENT: normocephalic, atraumatic Thyroid: no enlargement, no palpable nodules Pulmonary: Normal rate and effort, CTAB Cardiovascular: RRR Breast: Breast symmetrical, no tenderness, no palpable nodules or masses, no skin or nipple retraction present, no nipple discharge. No axillary or supraclavicular lymphadenopathy. Abdomen: soft, non-tender,  non-distended. No hepatomegaly, splenomegaly or masses palpable. No evidence of hernia  Genitourinary:  External: Normal external female genitalia. Normal urethral meatus  Vagina: Normal vaginal mucosa, no evidence of prolapse   Cervix: Grossly normal in appearance, no bleeding  Uterus: Non-enlarged, mobile, normal contour. No CMT  Adnexa: non-enlarged, no masses  Rectal: deferred Extremities: no edema, erythema, or tenderness Neurologic: Grossly intact Psychiatric: mood appropriate, affect full  Female chaperone present for pelvic and breast portions of the physical exam  Assessment:  1. Fertility testing   2. Screen for STD (sexually transmitted disease)   3. Well woman exam    Plan: Referral to [facility] for SA TSH A1C AMH Day 21 Progesterone  Follow up with GYN in 1 year or prn Follow up with PCP to establish care  [facility], CNM 07/24/2020 3:29 PM

## 2020-07-24 NOTE — Addendum Note (Signed)
Addended by: Marya Landry D on: 07/24/2020 03:51 PM   Modules accepted: Orders

## 2020-07-25 LAB — RPR+HBSAG+HCVAB+...
HIV Screen 4th Generation wRfx: NONREACTIVE
Hep C Virus Ab: 0.1 s/co ratio (ref 0.0–0.9)
Hepatitis B Surface Ag: NEGATIVE
RPR Ser Ql: NONREACTIVE

## 2020-07-25 LAB — CERVICOVAGINAL ANCILLARY ONLY
Chlamydia: NEGATIVE
Comment: NEGATIVE
Comment: NEGATIVE
Comment: NORMAL
Neisseria Gonorrhea: NEGATIVE
Trichomonas: NEGATIVE

## 2020-07-25 LAB — HEMOGLOBIN A1C
Est. average glucose Bld gHb Est-mCnc: 114 mg/dL
Hgb A1c MFr Bld: 5.6 % (ref 4.8–5.6)

## 2020-07-25 LAB — TSH: TSH: 1.51 u[IU]/mL (ref 0.450–4.500)

## 2020-07-26 NOTE — Addendum Note (Signed)
Addended by: Marya Landry D on: 07/26/2020 09:17 AM   Modules accepted: Orders

## 2020-07-29 LAB — ANTI MULLERIAN HORMONE: ANTI-MULLERIAN HORMONE (AMH): 2.24 ng/mL

## 2021-02-12 IMAGING — US US PELVIS COMPLETE WITH TRANSVAGINAL
1 series · 15 of 25 positions shown · non-contrast
Comparison: None

CLINICAL DATA: Pelvic pain RIGHT greater than LEFT, LMP 04/22/2020;
history of ovarian cysts

EXAM:
TRANSABDOMINAL AND TRANSVAGINAL ULTRASOUND OF PELVIS
TECHNIQUE: Both transabdominal and transvaginal ultrasound examinations of the
pelvis were performed. Transabdominal technique was performed for
global imaging of the pelvis including uterus, ovaries, adnexal
regions, and pelvic cul-de-sac. It was necessary to proceed with
endovaginal exam following the transabdominal exam to visualize the
lower uterine segment and ovaries.

[Series 1: us pelvis complete with transvaginal · 15 of 110 slices shown]
[im 1/110]
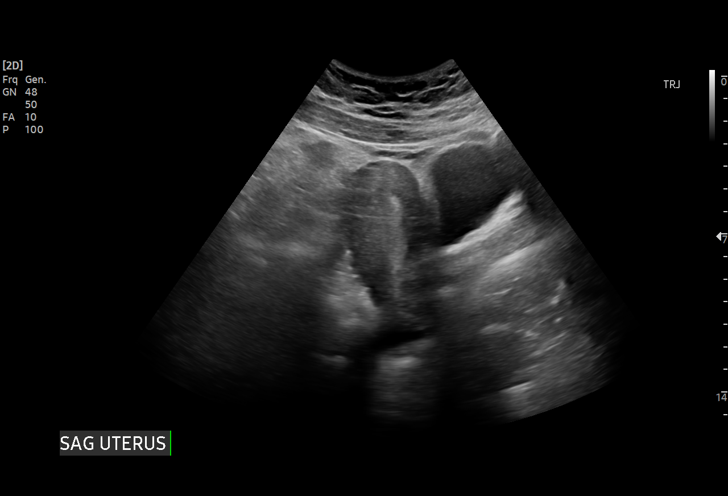
[im 10/110]
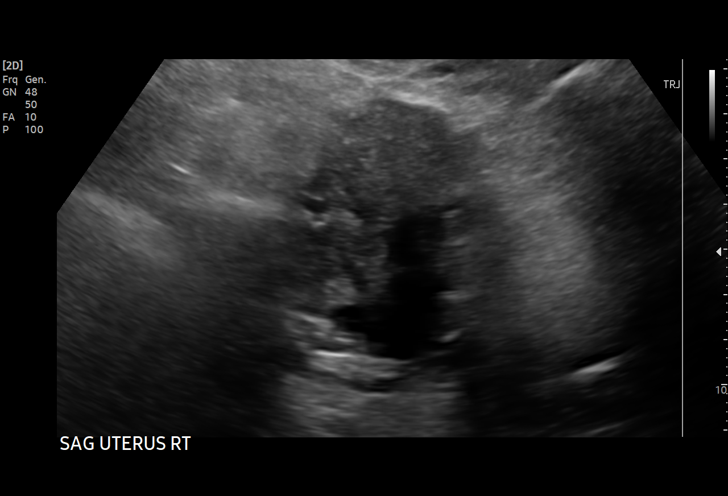
[im 19/110]
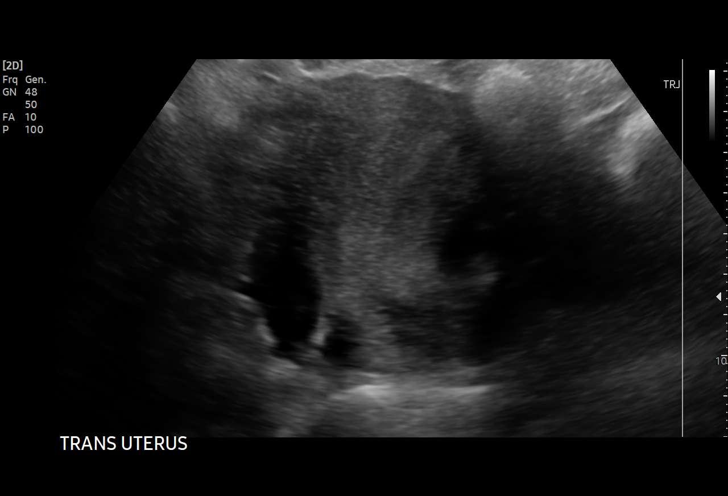
[im 23/110]
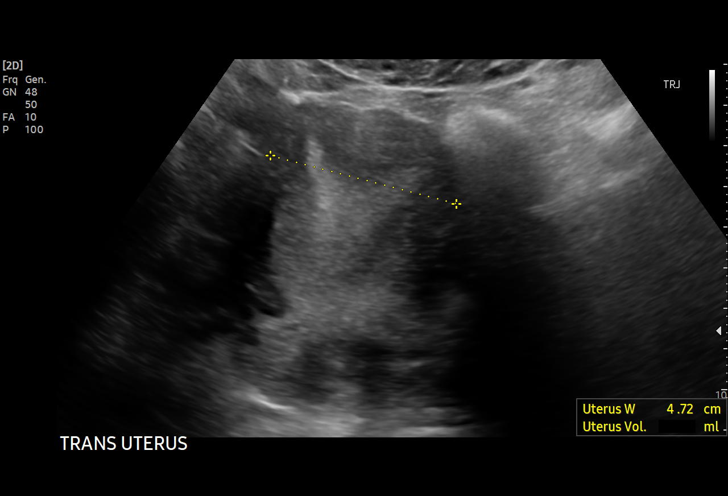
[im 32/110]
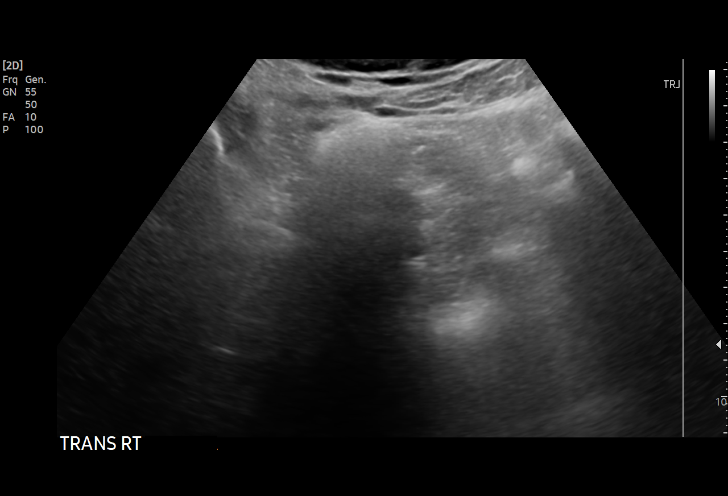
[im 41/110]
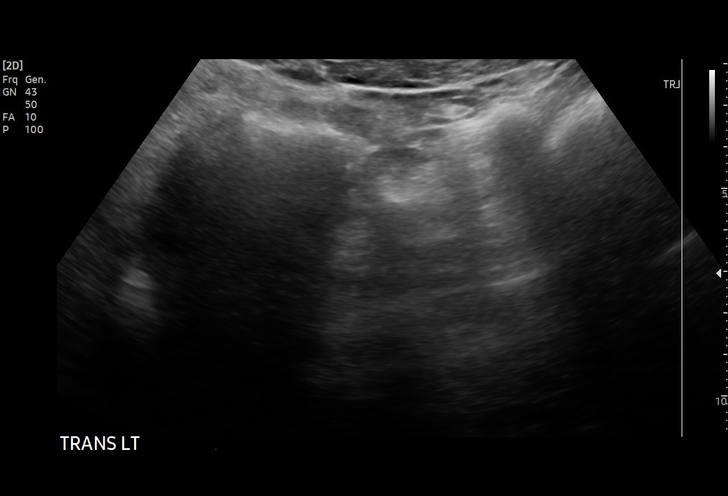
[im 46/110]
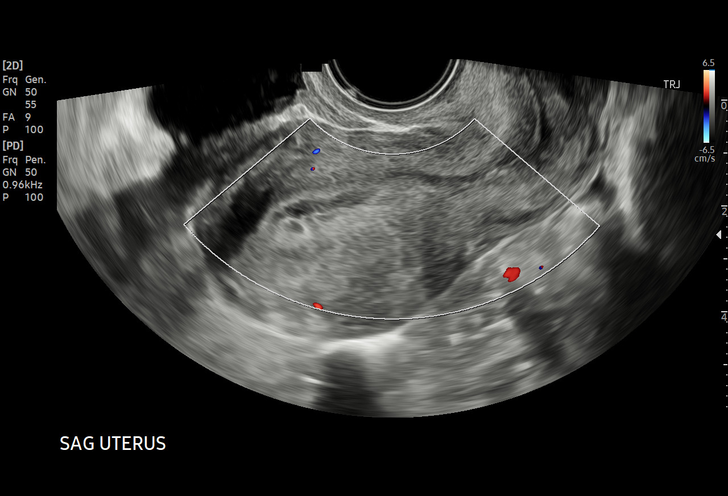
[im 55/110]
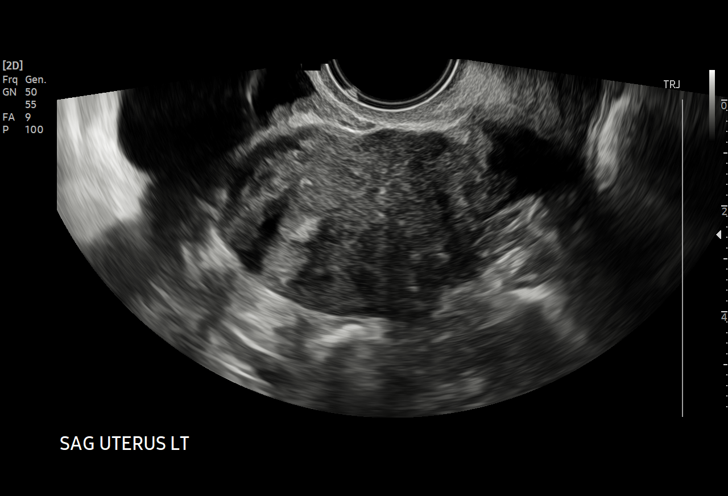
[im 64/110]
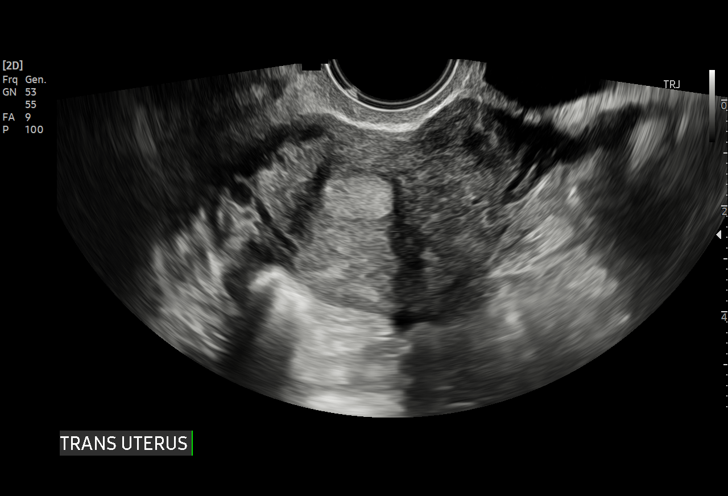
[im 69/110]
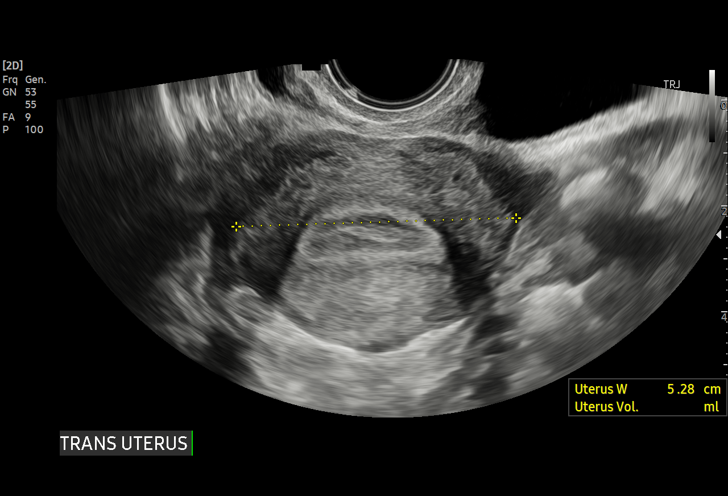
[im 78/110]
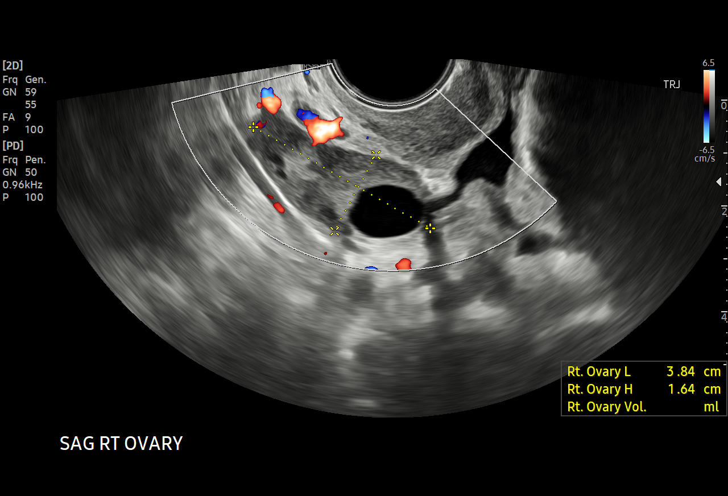
[im 87/110]
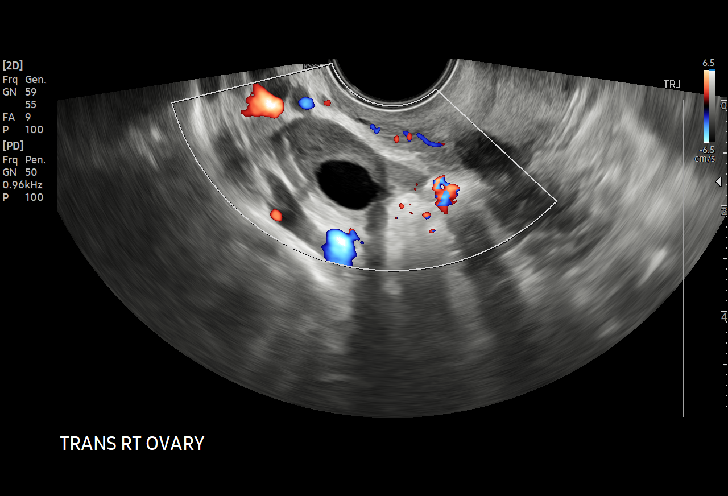
[im 91/110]
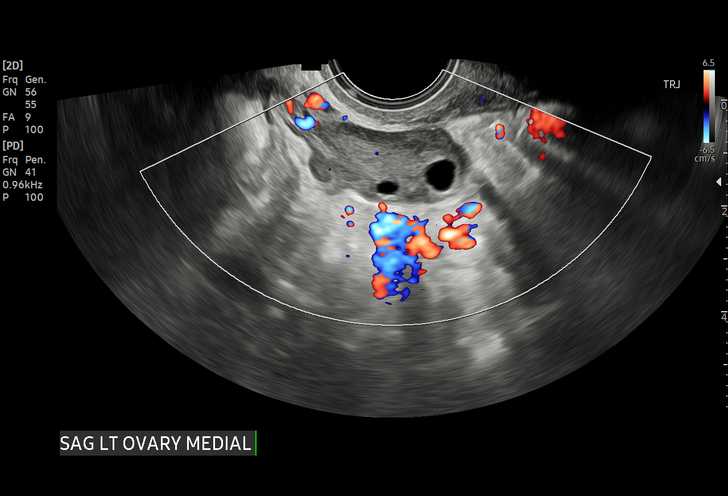
[im 100/110]
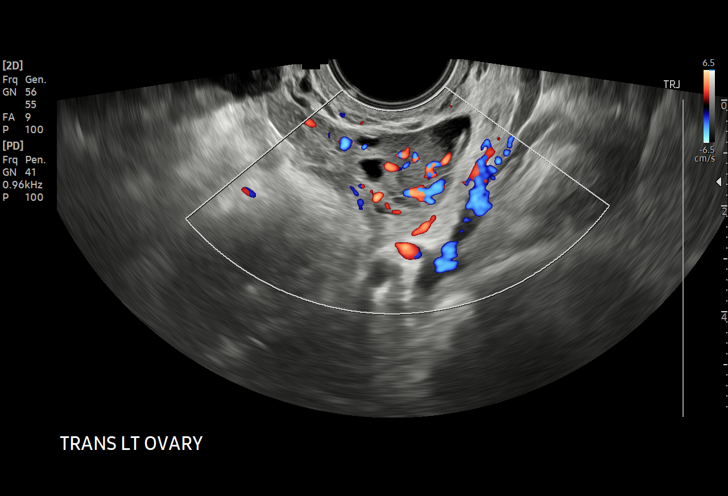
[im 110/110]
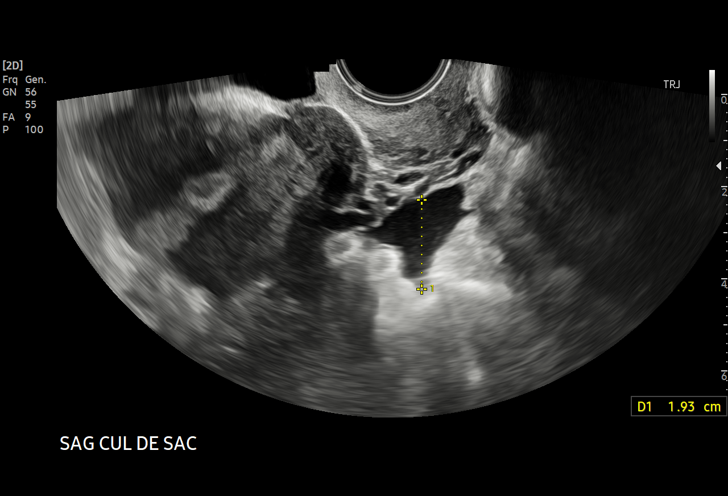

[15 of 25 positions shown; findings below may reference images not displayed]

FINDINGS: Uterus

Measurements: 7.6 x 4.1 x 4.7 cm = volume: 76 mL. Mildly anteflexed.
Heterogeneous myometrial echogenicity. Normal morphology with small
LEFT anterior subserosal leiomyoma 2.0 x 1.8 x 1.9 cm. No additional
masses.

Endometrium

Thickness: 11 mm. Heterogeneous. No focal mass or endometrial fluid.

Right ovary

Measurements: 3.8 x 1.6 x 2.0 cm = volume: 6.5 mL. Normal morphology
without mass

Left ovary

Measurements: 4.5 x 2.4 x 3.7 cm = volume: 20.4 mL. Small
hemorrhagic corpus luteum 2.7 cm greatest size; no follow-up imaging
recommended. No additional masses.

Other findings

Small amount of nonspecific free pelvic fluid. No additional adnexal
masses.
IMPRESSION: LEFT anterior subserosal leiomyoma 2.0 cm greatest size.

Small amount of nonspecific free pelvic fluid.

Otherwise negative exam.

## 2021-05-19 ENCOUNTER — Ambulatory Visit: Payer: BC Managed Care – PPO | Admitting: Obstetrics

## 2021-10-02 ENCOUNTER — Emergency Department (HOSPITAL_COMMUNITY)
Admission: EM | Admit: 2021-10-02 | Discharge: 2021-10-02 | Disposition: A | Discharge status: Home / Self Care | Payer: 59 | Attending: Emergency Medicine | Admitting: Emergency Medicine

## 2021-10-02 ENCOUNTER — Other Ambulatory Visit: Payer: Self-pay

## 2021-10-02 ENCOUNTER — Encounter (HOSPITAL_COMMUNITY): Payer: Self-pay

## 2021-10-02 DIAGNOSIS — R519 Headache, unspecified: Secondary | ICD-10-CM | POA: Diagnosis present

## 2021-10-02 DIAGNOSIS — M545 Low back pain, unspecified: Secondary | ICD-10-CM

## 2021-10-02 DIAGNOSIS — Y92410 Unspecified street and highway as the place of occurrence of the external cause: Secondary | ICD-10-CM | POA: Diagnosis not present
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
## 2021-11-28 ENCOUNTER — Ambulatory Visit (INDEPENDENT_AMBULATORY_CARE_PROVIDER_SITE_OTHER): Payer: 59 | Admitting: Emergency Medicine

## 2021-11-28 ENCOUNTER — Other Ambulatory Visit (HOSPITAL_COMMUNITY)
Admission: RE | Admit: 2021-11-28 | Discharge: 2021-11-28 | Disposition: A | Discharge status: Home / Self Care | Payer: 59 | Source: Ambulatory Visit | Attending: Obstetrics and Gynecology | Admitting: Obstetrics and Gynecology

## 2021-11-28 VITALS — BP 111/70 | HR 55 | Ht 63.0 in | Wt 192.7 lb

## 2021-11-28 DIAGNOSIS — Z113 Encounter for screening for infections with a predominantly sexual mode of transmission: Secondary | ICD-10-CM | POA: Insufficient documentation

## 2021-11-28 NOTE — Progress Notes (Signed)
Agree with nurses's documentation of this patient's clinic encounter.  Jemiah Ellenburg L, MD  

## 2021-11-28 NOTE — Progress Notes (Signed)
Pt presents for routine STD screen. Denies vaginal itching, irritation, discharge or odor. Denies exposure to STD. Requests STD blood work. Denies UTI symptoms.

## 2021-11-29 LAB — RPR: RPR Ser Ql: NONREACTIVE

## 2021-11-29 LAB — HIV ANTIBODY (ROUTINE TESTING W REFLEX): HIV Screen 4th Generation wRfx: NONREACTIVE

## 2021-11-29 LAB — HEPATITIS C ANTIBODY: Hep C Virus Ab: NONREACTIVE

## 2021-11-29 LAB — HEPATITIS B SURFACE ANTIGEN: Hepatitis B Surface Ag: NEGATIVE

## 2021-12-01 LAB — CERVICOVAGINAL ANCILLARY ONLY
Bacterial Vaginitis (gardnerella): NEGATIVE
Candida Glabrata: NEGATIVE
Candida Vaginitis: POSITIVE — AB
Chlamydia: NEGATIVE
Comment: NEGATIVE
Comment: NEGATIVE
Comment: NEGATIVE
Comment: NEGATIVE
Comment: NEGATIVE
Comment: NORMAL
Neisseria Gonorrhea: NEGATIVE
Trichomonas: NEGATIVE

## 2021-12-08 ENCOUNTER — Telehealth: Payer: Self-pay

## 2021-12-08 ENCOUNTER — Other Ambulatory Visit: Payer: Self-pay

## 2021-12-08 DIAGNOSIS — B3731 Acute candidiasis of vulva and vagina: Secondary | ICD-10-CM

## 2021-12-08 MED ORDER — FLUCONAZOLE 150 MG PO TABS: 150.0000 mg | ORAL_TABLET | ORAL | 3 refills | Status: AC

## 2021-12-08 NOTE — Telephone Encounter (Signed)
Error

## 2022-05-28 ENCOUNTER — Other Ambulatory Visit (HOSPITAL_COMMUNITY)
Admission: RE | Admit: 2022-05-28 | Discharge: 2022-05-28 | Disposition: A | Discharge status: Home / Self Care | Payer: 59 | Source: Ambulatory Visit | Attending: Obstetrics and Gynecology | Admitting: Obstetrics and Gynecology

## 2022-05-28 ENCOUNTER — Ambulatory Visit (INDEPENDENT_AMBULATORY_CARE_PROVIDER_SITE_OTHER): Payer: 59 | Admitting: *Deleted

## 2022-05-28 VITALS — BP 107/72 | HR 71

## 2022-05-28 DIAGNOSIS — Z113 Encounter for screening for infections with a predominantly sexual mode of transmission: Secondary | ICD-10-CM | POA: Diagnosis not present

## 2022-05-28 NOTE — Progress Notes (Signed)
SUBJECTIVE:  26 y.o. female who desires a STI screen. Denies abnormal vaginal discharge, bleeding or significant pelvic pain. No UTI symptoms. Denies history of known exposure to STD.  No LMP recorded.  OBJECTIVE:  She appears well.   ASSESSMENT:  STI Screen   PLAN:  Pt offered STI blood screening-requested GC, chlamydia, and trichomonas probe sent to lab.  Treatment: To be determined once lab results are received.  Pt follow up as needed.

## 2022-05-29 LAB — RPR: RPR Ser Ql: NONREACTIVE

## 2022-05-29 LAB — HEPATITIS B SURFACE ANTIGEN: Hepatitis B Surface Ag: NEGATIVE

## 2022-05-29 LAB — CERVICOVAGINAL ANCILLARY ONLY
Bacterial Vaginitis (gardnerella): NEGATIVE
Candida Glabrata: NEGATIVE
Candida Vaginitis: POSITIVE — AB
Chlamydia: NEGATIVE
Comment: NEGATIVE
Comment: NEGATIVE
Comment: NEGATIVE
Comment: NEGATIVE
Comment: NEGATIVE
Comment: NORMAL
Neisseria Gonorrhea: POSITIVE — AB
Trichomonas: NEGATIVE

## 2022-05-29 LAB — HIV ANTIBODY (ROUTINE TESTING W REFLEX): HIV Screen 4th Generation wRfx: NONREACTIVE

## 2022-05-29 LAB — HEPATITIS C ANTIBODY: Hep C Virus Ab: NONREACTIVE

## 2022-06-01 ENCOUNTER — Other Ambulatory Visit: Payer: Self-pay | Admitting: Obstetrics and Gynecology

## 2022-06-01 ENCOUNTER — Ambulatory Visit (INDEPENDENT_AMBULATORY_CARE_PROVIDER_SITE_OTHER): Payer: 59 | Admitting: General Practice

## 2022-06-01 ENCOUNTER — Other Ambulatory Visit: Payer: Self-pay | Admitting: *Deleted

## 2022-06-01 VITALS — BP 108/70 | HR 72 | Ht 63.0 in | Wt 194.5 lb

## 2022-06-01 DIAGNOSIS — A64 Unspecified sexually transmitted disease: Secondary | ICD-10-CM

## 2022-06-01 MED ORDER — CEFTRIAXONE SODIUM 500 MG IJ SOLR
500.0000 mg | Freq: Once | INTRAMUSCULAR | Status: AC
  Administered 2022-06-01: 500 mg via INTRAMUSCULAR

## 2022-06-01 MED ORDER — FLUCONAZOLE 150 MG PO TABS: 150.0000 mg | ORAL_TABLET | Freq: Once | ORAL | 0 refills | Status: AC

## 2022-06-01 NOTE — Progress Notes (Signed)
Pt presents for Rocephin injection. Tested positive for Gonorrhea 05-28-22.   500mg  Ceftriaxone reconstituted with 2.26mL of 1% Lidocaine. Injection given but 0m, CMA in the RUOQ per pt request. Pt  monitored in office for 20 minutes, tolerated well. Pt advised to return to office for TOC in 3 months.

## 2022-06-02 MED ORDER — FLUCONAZOLE 150 MG PO TABS: 150.0000 mg | ORAL_TABLET | Freq: Once | ORAL | 0 refills | Status: AC

## 2022-06-02 NOTE — Addendum Note (Signed)
Addended by: Catalina Antigua on: 06/02/2022 01:52 PM   Modules accepted: Orders

## 2022-08-03 ENCOUNTER — Other Ambulatory Visit (HOSPITAL_COMMUNITY)
Admission: RE | Admit: 2022-08-03 | Discharge: 2022-08-03 | Disposition: A | Discharge status: Home / Self Care | Payer: 59 | Source: Ambulatory Visit | Attending: Obstetrics and Gynecology | Admitting: Obstetrics and Gynecology

## 2022-08-03 ENCOUNTER — Ambulatory Visit (INDEPENDENT_AMBULATORY_CARE_PROVIDER_SITE_OTHER): Payer: 59

## 2022-08-03 VITALS — BP 108/73 | HR 74 | Wt 195.5 lb

## 2022-08-03 DIAGNOSIS — A64 Unspecified sexually transmitted disease: Secondary | ICD-10-CM

## 2022-08-03 DIAGNOSIS — N898 Other specified noninflammatory disorders of vagina: Secondary | ICD-10-CM | POA: Insufficient documentation

## 2022-08-03 NOTE — Progress Notes (Signed)
SUBJECTIVE:  27 y.o. female complains of white vaginal discharge. She requests all STD testing. Denies abnormal vaginal bleeding or significant pelvic pain or fever. No UTI symptoms.   LMP 08/03/2022  OBJECTIVE:  She appears well, afebrile. Urine dipstick: not done.  ASSESSMENT:  Vaginal Discharge  Vaginal Odor   PLAN:  Schedule an annual exam at checkout GC, chlamydia, trichomonas, BVAG, CVAG probe sent to lab. Treatment: To be determined once lab results are received ROV prn if symptoms persist or worsen.

## 2022-08-04 ENCOUNTER — Other Ambulatory Visit: Payer: Self-pay | Admitting: Obstetrics and Gynecology

## 2022-08-04 LAB — CERVICOVAGINAL ANCILLARY ONLY
Bacterial Vaginitis (gardnerella): POSITIVE — AB
Candida Glabrata: NEGATIVE
Candida Vaginitis: POSITIVE — AB
Chlamydia: NEGATIVE
Comment: NEGATIVE
Comment: NEGATIVE
Comment: NEGATIVE
Comment: NEGATIVE
Comment: NEGATIVE
Comment: NORMAL
Neisseria Gonorrhea: NEGATIVE
Trichomonas: NEGATIVE

## 2022-08-04 LAB — HIV ANTIBODY (ROUTINE TESTING W REFLEX): HIV Screen 4th Generation wRfx: NONREACTIVE

## 2022-08-04 LAB — HEPATITIS B SURFACE ANTIGEN: Hepatitis B Surface Ag: NEGATIVE

## 2022-08-04 LAB — HEPATITIS C ANTIBODY: Hep C Virus Ab: NONREACTIVE

## 2022-08-04 LAB — RPR: RPR Ser Ql: NONREACTIVE

## 2022-08-04 MED ORDER — FLUCONAZOLE 150 MG PO TABS: 150.0000 mg | ORAL_TABLET | Freq: Once | ORAL | 0 refills | Status: AC

## 2022-08-04 MED ORDER — METRONIDAZOLE 500 MG PO TABS: 500.0000 mg | ORAL_TABLET | Freq: Two times a day (BID) | ORAL | 0 refills | Status: AC

## 2024-05-03 ENCOUNTER — Ambulatory Visit

## 2024-05-03 ENCOUNTER — Other Ambulatory Visit (HOSPITAL_COMMUNITY)
Admission: RE | Admit: 2024-05-03 | Discharge: 2024-05-03 | Disposition: A | Discharge status: Home / Self Care | Source: Ambulatory Visit | Attending: Obstetrics and Gynecology | Admitting: Obstetrics and Gynecology

## 2024-05-03 VITALS — BP 107/71 | HR 71 | Wt 190.5 lb

## 2024-05-03 DIAGNOSIS — Z113 Encounter for screening for infections with a predominantly sexual mode of transmission: Secondary | ICD-10-CM | POA: Insufficient documentation

## 2024-05-03 NOTE — Progress Notes (Signed)
..  SUBJECTIVE:  28 y.o. female is in the office requesting self swab for std testing. Denies abnormal vaginal discharge, bleeding or significant pelvic pain or fever. No UTI symptoms. Denies history of known exposure to STD.  Patient's last menstrual period was 04/10/2024.  OBJECTIVE:  She appears well, afebrile. Urine dipstick: not done.  ASSESSMENT:  STD Screening  PLAN:  GC, chlamydia, trichomonas, BVAG, CVAG probe sent to lab. Treatment: To be determined once lab results are received ROV prn if symptoms persist or worsen.

## 2024-05-04 LAB — CERVICOVAGINAL ANCILLARY ONLY
Bacterial Vaginitis (gardnerella): POSITIVE — AB
Candida Glabrata: NEGATIVE
Candida Vaginitis: POSITIVE — AB
Chlamydia: NEGATIVE
Comment: NEGATIVE
Comment: NEGATIVE
Comment: NEGATIVE
Comment: NEGATIVE
Comment: NEGATIVE
Comment: NORMAL
Neisseria Gonorrhea: NEGATIVE
Trichomonas: POSITIVE — AB

## 2024-05-05 ENCOUNTER — Ambulatory Visit: Payer: Self-pay | Admitting: Obstetrics and Gynecology

## 2024-05-05 MED ORDER — FLUCONAZOLE 150 MG PO TABS: 150.0000 mg | ORAL_TABLET | Freq: Once | ORAL | 0 refills | Status: AC

## 2024-05-05 MED ORDER — METRONIDAZOLE 500 MG PO TABS: 500.0000 mg | ORAL_TABLET | Freq: Two times a day (BID) | ORAL | 0 refills | Status: AC
